# Patient Record
Sex: Female | Born: 1955 | Race: White | Hispanic: No | Marital: Married | Smoking: Former smoker
Health system: Southern US, Community
[De-identification: ages and names within clinical notes are randomized; demographics above are authoritative.]

## PROBLEM LIST (undated history)

## (undated) DIAGNOSIS — D649 Anemia, unspecified: Secondary | ICD-10-CM

## (undated) DIAGNOSIS — Z8719 Personal history of other diseases of the digestive system: Secondary | ICD-10-CM

## (undated) DIAGNOSIS — F329 Major depressive disorder, single episode, unspecified: Secondary | ICD-10-CM

## (undated) DIAGNOSIS — IMO0001 Reserved for inherently not codable concepts without codable children: Secondary | ICD-10-CM

## (undated) DIAGNOSIS — F32A Depression, unspecified: Secondary | ICD-10-CM

## (undated) DIAGNOSIS — K222 Esophageal obstruction: Secondary | ICD-10-CM

## (undated) DIAGNOSIS — M810 Age-related osteoporosis without current pathological fracture: Secondary | ICD-10-CM

## (undated) DIAGNOSIS — I10 Essential (primary) hypertension: Secondary | ICD-10-CM

## (undated) DIAGNOSIS — K219 Gastro-esophageal reflux disease without esophagitis: Secondary | ICD-10-CM

## (undated) DIAGNOSIS — J45909 Unspecified asthma, uncomplicated: Secondary | ICD-10-CM

## (undated) DIAGNOSIS — R1013 Epigastric pain: Secondary | ICD-10-CM

## (undated) HISTORY — DX: Age-related osteoporosis without current pathological fracture: M81.0

## (undated) HISTORY — DX: Reserved for inherently not codable concepts without codable children: IMO0001

## (undated) HISTORY — DX: Gastro-esophageal reflux disease without esophagitis: K21.9

## (undated) HISTORY — DX: Essential (primary) hypertension: I10

---
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
## 1996-03-31 HISTORY — PX: ANAL FISTULECTOMY: SHX1139

## 1996-03-31 HISTORY — PX: FISTULOTOMY: SHX6413

## 1998-09-11 ENCOUNTER — Other Ambulatory Visit: Admission: RE | Admit: 1998-09-11 | Discharge: 1998-09-11 | Payer: Self-pay | Admitting: Obstetrics and Gynecology

## 1999-10-09 ENCOUNTER — Other Ambulatory Visit: Admission: RE | Admit: 1999-10-09 | Discharge: 1999-10-09 | Payer: Self-pay | Admitting: Obstetrics and Gynecology

## 2000-10-29 ENCOUNTER — Other Ambulatory Visit: Admission: RE | Admit: 2000-10-29 | Discharge: 2000-10-29 | Payer: Self-pay | Admitting: Obstetrics and Gynecology

## 2001-11-26 ENCOUNTER — Other Ambulatory Visit: Admission: RE | Admit: 2001-11-26 | Discharge: 2001-11-26 | Payer: Self-pay | Admitting: Obstetrics and Gynecology

## 2002-11-29 ENCOUNTER — Other Ambulatory Visit: Admission: RE | Admit: 2002-11-29 | Discharge: 2002-11-29 | Payer: Self-pay | Admitting: Obstetrics and Gynecology

## 2003-12-06 ENCOUNTER — Other Ambulatory Visit: Admission: RE | Admit: 2003-12-06 | Discharge: 2003-12-06 | Payer: Self-pay | Admitting: Obstetrics and Gynecology

## 2004-04-26 ENCOUNTER — Ambulatory Visit: Payer: Self-pay | Admitting: Internal Medicine

## 2004-06-19 ENCOUNTER — Ambulatory Visit: Payer: Self-pay | Admitting: Internal Medicine

## 2004-11-28 ENCOUNTER — Ambulatory Visit: Payer: Self-pay | Admitting: Internal Medicine

## 2004-12-31 ENCOUNTER — Other Ambulatory Visit: Admission: RE | Admit: 2004-12-31 | Discharge: 2004-12-31 | Payer: Self-pay | Admitting: Obstetrics and Gynecology

## 2005-01-17 ENCOUNTER — Ambulatory Visit: Payer: Self-pay

## 2005-03-21 ENCOUNTER — Ambulatory Visit: Payer: Self-pay | Admitting: Internal Medicine

## 2005-10-06 ENCOUNTER — Ambulatory Visit: Payer: Self-pay | Admitting: Internal Medicine

## 2006-01-20 ENCOUNTER — Ambulatory Visit: Payer: Self-pay | Admitting: Internal Medicine

## 2006-02-17 ENCOUNTER — Ambulatory Visit: Payer: Self-pay | Admitting: Gastroenterology

## 2006-12-22 ENCOUNTER — Ambulatory Visit: Payer: Self-pay | Admitting: Internal Medicine

## 2007-01-20 ENCOUNTER — Ambulatory Visit: Payer: Self-pay | Admitting: Physician Assistant

## 2007-01-25 ENCOUNTER — Ambulatory Visit: Payer: Self-pay

## 2007-10-12 ENCOUNTER — Ambulatory Visit: Payer: Self-pay | Admitting: Internal Medicine

## 2008-01-27 ENCOUNTER — Ambulatory Visit: Payer: Self-pay

## 2008-05-19 ENCOUNTER — Ambulatory Visit: Payer: Self-pay | Admitting: Internal Medicine

## 2008-10-30 ENCOUNTER — Ambulatory Visit: Payer: Self-pay | Admitting: Gastroenterology

## 2009-02-01 ENCOUNTER — Ambulatory Visit: Payer: Self-pay

## 2009-07-16 ENCOUNTER — Other Ambulatory Visit: Payer: Self-pay | Admitting: Podiatry

## 2009-12-25 ENCOUNTER — Other Ambulatory Visit: Payer: Self-pay | Admitting: Internal Medicine

## 2010-01-03 ENCOUNTER — Other Ambulatory Visit: Payer: Self-pay | Admitting: Internal Medicine

## 2010-03-27 ENCOUNTER — Ambulatory Visit: Payer: Self-pay

## 2010-07-22 ENCOUNTER — Other Ambulatory Visit: Payer: Self-pay | Admitting: Internal Medicine

## 2010-08-22 ENCOUNTER — Ambulatory Visit: Payer: Self-pay | Admitting: Gastroenterology

## 2010-12-09 ENCOUNTER — Other Ambulatory Visit: Payer: Self-pay | Admitting: Internal Medicine

## 2011-05-02 ENCOUNTER — Ambulatory Visit: Payer: Self-pay | Admitting: Internal Medicine

## 2011-07-23 ENCOUNTER — Other Ambulatory Visit: Payer: Self-pay | Admitting: Internal Medicine

## 2011-07-23 LAB — CBC WITH DIFFERENTIAL/PLATELET
Basophil %: 1 %
Eosinophil #: 0.1 10*3/uL (ref 0.0–0.7)
HGB: 11.6 g/dL — ABNORMAL LOW (ref 12.0–16.0)
MCH: 25.8 pg — ABNORMAL LOW (ref 26.0–34.0)
MCHC: 32.4 g/dL (ref 32.0–36.0)
Monocyte #: 0.5 x10 3/mm (ref 0.2–0.9)
Monocyte %: 7.3 %
Neutrophil #: 3.4 10*3/uL (ref 1.4–6.5)
Neutrophil %: 54.2 %
RDW: 18.2 % — ABNORMAL HIGH (ref 11.5–14.5)
WBC: 6.3 10*3/uL (ref 3.6–11.0)

## 2011-07-23 LAB — BASIC METABOLIC PANEL
BUN: 14 mg/dL (ref 7–18)
Chloride: 105 mmol/L (ref 98–107)
Co2: 27 mmol/L (ref 21–32)
EGFR (African American): >60
EGFR (Non-African Amer.): >60
Glucose: 83 mg/dL (ref 65–99)
Osmolality: 281 (ref 275–301)
Sodium: 141 mmol/L (ref 136–145)

## 2011-07-23 LAB — URINALYSIS, COMPLETE
Bacteria: NONE SEEN
Bilirubin,UR: NEGATIVE
Ketone: NEGATIVE
Leukocyte Esterase: NEGATIVE
Protein: NEGATIVE
WBC UR: 1 /HPF (ref 0–5)

## 2011-07-23 LAB — IRON: Iron: 50 ug/dL (ref 50–170)

## 2011-07-23 LAB — HEPATIC FUNCTION PANEL A (ARMC)
Albumin: 4 g/dL (ref 3.4–5.0)
Alkaline Phosphatase: 43 U/L — ABNORMAL LOW (ref 50–136)
Bilirubin, Direct: <0.1 mg/dL (ref 0.00–0.20)
SGOT(AST): 27 U/L (ref 15–37)

## 2011-07-23 LAB — TSH: Thyroid Stimulating Horm: 1.79 u[IU]/mL

## 2012-07-02 ENCOUNTER — Ambulatory Visit: Payer: Self-pay | Admitting: Internal Medicine

## 2012-08-13 ENCOUNTER — Other Ambulatory Visit: Payer: Self-pay | Admitting: Internal Medicine

## 2012-08-13 LAB — COMPREHENSIVE METABOLIC PANEL
Albumin: 3.9 g/dL (ref 3.4–5.0)
Anion Gap: 5 — ABNORMAL LOW (ref 7–16)
BUN: 11 mg/dL (ref 7–18)
Bilirubin,Total: 0.2 mg/dL (ref 0.2–1.0)
Co2: 30 mmol/L (ref 21–32)
EGFR (African American): >60
Osmolality: 276 (ref 275–301)
Potassium: 3.8 mmol/L (ref 3.5–5.1)
SGPT (ALT): 30 U/L (ref 12–78)
Sodium: 139 mmol/L (ref 136–145)

## 2012-08-13 LAB — URINALYSIS, COMPLETE
Blood: NEGATIVE
Glucose,UR: NEGATIVE mg/dL (ref 0–75)
Ph: 8 (ref 4.5–8.0)
RBC,UR: 2 /HPF (ref 0–5)
Specific Gravity: 1.01 (ref 1.003–1.030)
WBC UR: 2 /HPF (ref 0–5)

## 2012-08-13 LAB — CBC WITH DIFFERENTIAL/PLATELET
Basophil #: 0.1 10*3/uL (ref 0.0–0.1)
Eosinophil %: 1.4 %
HGB: 12.6 g/dL (ref 12.0–16.0)
Lymphocyte %: 32.1 %
MCH: 28.5 pg (ref 26.0–34.0)
MCHC: 33.9 g/dL (ref 32.0–36.0)
MCV: 84 fL (ref 80–100)
Monocyte %: 6.9 %
Platelet: 250 10*3/uL (ref 150–440)
RBC: 4.42 10*6/uL (ref 3.80–5.20)
RDW: 14.6 % — ABNORMAL HIGH (ref 11.5–14.5)
WBC: 7.4 10*3/uL (ref 3.6–11.0)

## 2013-02-10 ENCOUNTER — Encounter: Payer: Self-pay | Admitting: Podiatry

## 2013-02-11 ENCOUNTER — Ambulatory Visit (INDEPENDENT_AMBULATORY_CARE_PROVIDER_SITE_OTHER): Payer: 59

## 2013-02-11 ENCOUNTER — Encounter: Payer: Self-pay | Admitting: Podiatry

## 2013-02-11 ENCOUNTER — Ambulatory Visit (INDEPENDENT_AMBULATORY_CARE_PROVIDER_SITE_OTHER): Payer: 59 | Admitting: Podiatry

## 2013-02-11 VITALS — Ht 64.0 in | Wt 178.0 lb

## 2013-02-11 DIAGNOSIS — M2040 Other hammer toe(s) (acquired), unspecified foot: Secondary | ICD-10-CM

## 2013-02-11 DIAGNOSIS — Z9889 Other specified postprocedural states: Secondary | ICD-10-CM

## 2013-02-11 DIAGNOSIS — M779 Enthesopathy, unspecified: Secondary | ICD-10-CM

## 2013-02-11 NOTE — Progress Notes (Signed)
Subjective:     Patient ID: Lisa Herman, female   DOB: 04-Sep-1955, 57 y.o.   MRN: [id number]  HPI patient states I'm doing much better and I am walking without discomfort in very happy with my results   Review of Systems     Objective:   Physical Exam  Nursing note and vitals reviewed. Constitutional: She is oriented to person, place, and time.  Cardiovascular: Intact distal pulses.   Musculoskeletal: Normal range of motion.  Neurological: She is oriented to person, place, and time.   patient's right toe is in very good alignment and the range of motion is excellent    Assessment:     Well-healed after surgery of the second metatarsal right    Plan:     Reviewed findings and discharge patient and less any symptoms should occur after x-rays were discussed with her

## 2013-02-15 ENCOUNTER — Encounter: Payer: Self-pay | Admitting: Podiatry

## 2013-03-08 ENCOUNTER — Other Ambulatory Visit: Payer: Self-pay | Admitting: Internal Medicine

## 2013-03-08 LAB — BASIC METABOLIC PANEL
Anion Gap: 4 — ABNORMAL LOW (ref 7–16)
BUN: 16 mg/dL (ref 7–18)
Calcium, Total: 9.1 mg/dL (ref 8.5–10.1)
Chloride: 103 mmol/L (ref 98–107)
Co2: 33 mmol/L — ABNORMAL HIGH (ref 21–32)
Creatinine: 0.77 mg/dL (ref 0.60–1.30)
EGFR (Non-African Amer.): >60
Sodium: 140 mmol/L (ref 136–145)

## 2013-03-08 LAB — HEPATIC FUNCTION PANEL A (ARMC)
Alkaline Phosphatase: 55 U/L
Bilirubin,Total: 0.3 mg/dL (ref 0.2–1.0)
SGPT (ALT): 42 U/L (ref 12–78)
Total Protein: 7.3 g/dL (ref 6.4–8.2)

## 2013-03-08 LAB — LIPID PANEL
HDL Cholesterol: 38 mg/dL — ABNORMAL LOW (ref 40–60)
Ldl Cholesterol, Calc: 168 mg/dL — ABNORMAL HIGH (ref 0–100)
Triglycerides: 190 mg/dL (ref 0–200)

## 2013-09-16 ENCOUNTER — Ambulatory Visit: Payer: Self-pay | Admitting: Internal Medicine

## 2013-11-01 ENCOUNTER — Ambulatory Visit: Payer: Self-pay | Admitting: Internal Medicine

## 2014-03-06 DIAGNOSIS — D649 Anemia, unspecified: Secondary | ICD-10-CM | POA: Insufficient documentation

## 2014-05-05 ENCOUNTER — Ambulatory Visit: Payer: 59 | Admitting: Gastroenterology

## 2015-03-14 DIAGNOSIS — I10 Essential (primary) hypertension: Secondary | ICD-10-CM | POA: Insufficient documentation

## 2015-03-14 DIAGNOSIS — F3342 Major depressive disorder, recurrent, in full remission: Secondary | ICD-10-CM | POA: Insufficient documentation

## 2015-04-16 DIAGNOSIS — I10 Essential (primary) hypertension: Secondary | ICD-10-CM | POA: Diagnosis not present

## 2015-04-16 DIAGNOSIS — R739 Hyperglycemia, unspecified: Secondary | ICD-10-CM | POA: Diagnosis not present

## 2015-04-16 DIAGNOSIS — E784 Other hyperlipidemia: Secondary | ICD-10-CM | POA: Diagnosis not present

## 2015-04-16 DIAGNOSIS — R1013 Epigastric pain: Secondary | ICD-10-CM | POA: Diagnosis not present

## 2015-04-30 DIAGNOSIS — Z1231 Encounter for screening mammogram for malignant neoplasm of breast: Secondary | ICD-10-CM | POA: Diagnosis not present

## 2015-05-17 DIAGNOSIS — F3342 Major depressive disorder, recurrent, in full remission: Secondary | ICD-10-CM | POA: Diagnosis not present

## 2015-05-17 DIAGNOSIS — R739 Hyperglycemia, unspecified: Secondary | ICD-10-CM | POA: Diagnosis not present

## 2015-05-17 DIAGNOSIS — I10 Essential (primary) hypertension: Secondary | ICD-10-CM | POA: Diagnosis not present

## 2015-05-17 DIAGNOSIS — D6489 Other specified anemias: Secondary | ICD-10-CM | POA: Diagnosis not present

## 2015-07-20 DIAGNOSIS — N390 Urinary tract infection, site not specified: Secondary | ICD-10-CM | POA: Diagnosis not present

## 2015-07-20 DIAGNOSIS — R399 Unspecified symptoms and signs involving the genitourinary system: Secondary | ICD-10-CM | POA: Diagnosis not present

## 2015-09-28 ENCOUNTER — Telehealth: Payer: Self-pay | Admitting: Family

## 2015-09-28 DIAGNOSIS — N390 Urinary tract infection, site not specified: Secondary | ICD-10-CM

## 2015-09-28 MED ORDER — SULFAMETHOXAZOLE-TRIMETHOPRIM 800-160 MG PO TABS
1.0000 | ORAL_TABLET | Freq: Two times a day (BID) | ORAL | Status: DC
Start: 2015-09-28 — End: 2016-01-04

## 2015-09-28 NOTE — Progress Notes (Signed)

## 2015-10-04 DIAGNOSIS — L570 Actinic keratosis: Secondary | ICD-10-CM | POA: Diagnosis not present

## 2015-10-04 DIAGNOSIS — D225 Melanocytic nevi of trunk: Secondary | ICD-10-CM | POA: Diagnosis not present

## 2015-10-04 DIAGNOSIS — L821 Other seborrheic keratosis: Secondary | ICD-10-CM | POA: Diagnosis not present

## 2015-10-04 DIAGNOSIS — D1800 Hemangioma unspecified site: Secondary | ICD-10-CM | POA: Diagnosis not present

## 2015-10-04 DIAGNOSIS — L814 Other melanin hyperpigmentation: Secondary | ICD-10-CM | POA: Diagnosis not present

## 2015-10-22 DIAGNOSIS — I10 Essential (primary) hypertension: Secondary | ICD-10-CM | POA: Diagnosis not present

## 2015-10-22 DIAGNOSIS — E784 Other hyperlipidemia: Secondary | ICD-10-CM | POA: Diagnosis not present

## 2015-10-22 DIAGNOSIS — D6489 Other specified anemias: Secondary | ICD-10-CM | POA: Diagnosis not present

## 2015-10-22 DIAGNOSIS — M810 Age-related osteoporosis without current pathological fracture: Secondary | ICD-10-CM | POA: Diagnosis not present

## 2015-10-22 DIAGNOSIS — R739 Hyperglycemia, unspecified: Secondary | ICD-10-CM | POA: Diagnosis not present

## 2015-11-02 ENCOUNTER — Telehealth: Payer: 59 | Admitting: Nurse Practitioner

## 2015-11-02 DIAGNOSIS — L237 Allergic contact dermatitis due to plants, except food: Secondary | ICD-10-CM

## 2015-11-02 MED ORDER — METHYLPREDNISOLONE 4 MG PO TBPK: ORAL_TABLET | ORAL | 0 refills | Status: DC

## 2015-11-02 NOTE — Progress Notes (Signed)

## 2015-11-05 ENCOUNTER — Other Ambulatory Visit: Payer: Self-pay | Admitting: Internal Medicine

## 2015-11-05 DIAGNOSIS — Z0001 Encounter for general adult medical examination with abnormal findings: Secondary | ICD-10-CM | POA: Diagnosis not present

## 2015-11-05 DIAGNOSIS — R739 Hyperglycemia, unspecified: Secondary | ICD-10-CM | POA: Diagnosis not present

## 2015-11-05 DIAGNOSIS — R748 Abnormal levels of other serum enzymes: Secondary | ICD-10-CM | POA: Diagnosis not present

## 2015-11-05 DIAGNOSIS — E784 Other hyperlipidemia: Secondary | ICD-10-CM | POA: Diagnosis not present

## 2015-11-05 DIAGNOSIS — M810 Age-related osteoporosis without current pathological fracture: Secondary | ICD-10-CM | POA: Diagnosis not present

## 2015-11-08 ENCOUNTER — Ambulatory Visit
Admission: RE | Admit: 2015-11-08 | Discharge: 2015-11-08 | Disposition: A | Discharge status: Home / Self Care | Payer: 59 | Source: Ambulatory Visit | Attending: Internal Medicine | Admitting: Internal Medicine

## 2015-11-08 DIAGNOSIS — R932 Abnormal findings on diagnostic imaging of liver and biliary tract: Secondary | ICD-10-CM | POA: Diagnosis not present

## 2015-11-08 DIAGNOSIS — R748 Abnormal levels of other serum enzymes: Secondary | ICD-10-CM | POA: Diagnosis not present

## 2015-11-08 DIAGNOSIS — R7989 Other specified abnormal findings of blood chemistry: Secondary | ICD-10-CM | POA: Diagnosis not present

## 2015-11-22 DIAGNOSIS — Z01419 Encounter for gynecological examination (general) (routine) without abnormal findings: Secondary | ICD-10-CM | POA: Diagnosis not present

## 2015-11-22 DIAGNOSIS — Z6831 Body mass index (BMI) 31.0-31.9, adult: Secondary | ICD-10-CM | POA: Diagnosis not present

## 2015-12-06 ENCOUNTER — Encounter: Payer: Self-pay | Admitting: Urology

## 2015-12-06 ENCOUNTER — Ambulatory Visit (INDEPENDENT_AMBULATORY_CARE_PROVIDER_SITE_OTHER): Payer: 59 | Admitting: Urology

## 2015-12-06 VITALS — BP 129/90 | HR 109 | Ht 64.0 in | Wt 182.0 lb

## 2015-12-06 DIAGNOSIS — N390 Urinary tract infection, site not specified: Secondary | ICD-10-CM

## 2015-12-06 DIAGNOSIS — N3281 Overactive bladder: Secondary | ICD-10-CM | POA: Diagnosis not present

## 2015-12-06 DIAGNOSIS — M810 Age-related osteoporosis without current pathological fracture: Secondary | ICD-10-CM | POA: Insufficient documentation

## 2015-12-06 DIAGNOSIS — R1013 Epigastric pain: Secondary | ICD-10-CM | POA: Insufficient documentation

## 2015-12-06 DIAGNOSIS — R739 Hyperglycemia, unspecified: Secondary | ICD-10-CM | POA: Insufficient documentation

## 2015-12-06 DIAGNOSIS — E785 Hyperlipidemia, unspecified: Secondary | ICD-10-CM | POA: Insufficient documentation

## 2015-12-06 LAB — URINALYSIS, COMPLETE
BILIRUBIN UA: NEGATIVE
GLUCOSE, UA: NEGATIVE
Ketones, UA: NEGATIVE
LEUKOCYTES UA: NEGATIVE
Nitrite, UA: NEGATIVE
PROTEIN UA: NEGATIVE
SPEC GRAV UA: 1.015 (ref 1.005–1.030)
UUROB: 0.2 mg/dL (ref 0.2–1.0)
pH, UA: 7.5 (ref 5.0–7.5)

## 2015-12-06 LAB — MICROSCOPIC EXAMINATION: Bacteria, UA: NONE SEEN

## 2015-12-06 LAB — BLADDER SCAN AMB NON-IMAGING: SCAN RESULT: 47

## 2015-12-06 MED ORDER — MIRABEGRON ER 25 MG PO TB24: 25.0000 mg | ORAL_TABLET | Freq: Every day | ORAL | 11 refills | Status: DC

## 2015-12-06 NOTE — Progress Notes (Signed)
12/06/2015 11:59 AM   Lisa Herman November 20, 1955 [id number]  Referring provider: Adin Hector, MD [place], [place] [address]  Chief Complaint  Patient presents with  . New Patient (Initial Visit)    recurrent UTI    HPI: The patient is a 60 year old female who presents for urinary symptoms as well as recurrent UTI. Since menopause, she has had 3-4 UTIs per year. She also had asymptomatic bacteriuria in April 2017 with Proteus. She was not treated for this.  She also has intermittent urinary urgency and nocturia 5. She feels this feels different than her UTIs. It is intermittent but when it occurs it is very bothersome.   Of note she has a strong family history of nephrolithiasis. Her mother recently underwent surgery for bilateral staghorn calculi.    She has been on estrogen therapy before but this was for vaginal dryness. She has had laser rejuvenation which has worked for dryness. She had a pelvic exam by her gynecologist 2 weeks ago which she reports as normal.   PMH: Past Medical History:  Diagnosis Date  . High blood pressure   . Osteoporosis   . Reflux     Surgical History: Past Surgical History:  Procedure Laterality Date  . ANAL FISTULECTOMY  1998  . FISTULOTOMY  1998  . TONSILLECTOMY  1972  . TONSILLECTOMY  1972    Home Medications:    Medication List       Accurate as of 12/06/15 11:59 AM. Always use your most recent med list.          albuterol 108 (90 Base) MCG/ACT inhaler Commonly known as:  PROVENTIL HFA;VENTOLIN HFA Inhale into the lungs.   aspirin EC 81 MG tablet Take by mouth.   ezetimibe 10 MG tablet Commonly known as:  ZETIA Take by mouth.   ibandronate 150 MG tablet Commonly known as:  BONIVA Take by mouth.   losartan-hydrochlorothiazide 100-25 MG tablet Commonly known as:  HYZAAR TAKE ONE TABLET BY MOUTH DAILY   methylPREDNISolone 4 MG Tbpk tablet Commonly known as:  MEDROL  DOSEPAK Take as directed   mirabegron ER 25 MG Tb24 tablet Commonly known as:  MYRBETRIQ Take 1 tablet (25 mg total) by mouth daily.   raloxifene 60 MG tablet Commonly known as:  EVISTA Take 60 mg by mouth daily.   sulfamethoxazole-trimethoprim 800-160 MG tablet Commonly known as:  BACTRIM DS,SEPTRA DS Take 1 tablet by mouth 2 (two) times daily.       Allergies:  Allergies  Allergen Reactions  . Atorvastatin     Other reaction(s): Abdominal Pain  . Bupropion     Other reaction(s): Other (See Comments) Elevated BP  . Sertraline     Other reaction(s): Other (See Comments) Prolonged QT interval  . Topiramate Other (See Comments)    Memory loss    Family History: Family History  Problem Relation Age of Onset  . High blood pressure Mother     Social History:  reports that she has quit smoking. Her smoking use included Cigarettes. She has never used smokeless tobacco. She reports that she drinks about 0.5 oz of alcohol per week . She reports that she does not use drugs.  ROS: UROLOGY Frequent Urination?: Yes Hard to postpone urination?: Yes Burning/pain with urination?: No Get up at night to urinate?: Yes Leakage of urine?: Yes Urine stream starts and stops?: No Trouble starting stream?: No Do you have to strain to urinate?: No Blood  in urine?: No Urinary tract infection?: Yes Sexually transmitted disease?: No Injury to kidneys or bladder?: No Painful intercourse?: No Weak stream?: Yes Currently pregnant?: No Vaginal bleeding?: No Last menstrual period?: n  Gastrointestinal Nausea?: No Vomiting?: No Indigestion/heartburn?: Yes Diarrhea?: No Constipation?: No  Constitutional Fever: No Night sweats?: No Weight loss?: No Fatigue?: No  Skin Skin rash/lesions?: No Itching?: No  Eyes Blurred vision?: No Double vision?: No  Ears/Nose/Throat Sore throat?: No Sinus problems?: No  Hematologic/Lymphatic Swollen glands?: No Easy bruising?:  No  Cardiovascular Leg swelling?: No Chest pain?: No  Respiratory Cough?: No Shortness of breath?: No  Endocrine Excessive thirst?: No  Musculoskeletal Back pain?: No Joint pain?: No  Neurological Headaches?: No Dizziness?: No  Psychologic Depression?: No Anxiety?: No  Physical Exam: BP 129/90   Pulse (!) 109 Comment: possible related to menopausal Hot Flash  Ht 5\' 4"  (1.626 m)   Wt 182 lb (82.6 kg) Comment: pt reported.  BMI 31.24 kg/m   Constitutional:  Alert and oriented, No acute distress. HEENT: [place] AT, moist mucus membranes.  Trachea midline, no masses. Cardiovascular: No clubbing, cyanosis, or edema. Respiratory: Normal respiratory effort, no increased work of breathing. GI: Abdomen is soft, nontender, nondistended, no abdominal masses GU: No CVA tenderness.  Skin: No rashes, bruises or suspicious lesions. Lymph: No cervical or inguinal adenopathy. Neurologic: Grossly intact, no focal deficits, moving all 4 extremities. Psychiatric: Normal mood and affect.  Laboratory Data: Lab Results  Component Value Date   WBC 7.4 08/13/2012   HGB 12.6 08/13/2012   HCT 37.1 08/13/2012   MCV 84 08/13/2012   PLT 250 08/13/2012    Lab Results  Component Value Date   CREATININE 0.77 03/08/2013    No results found for: PSA  No results found for: TESTOSTERONE  Lab Results  Component Value Date   HGBA1C 5.7 03/08/2013    Urinalysis    Component Value Date/Time   COLORURINE Colorless 08/13/2012 0905   APPEARANCEUR Clear 08/13/2012 0905   LABSPEC 1.010 08/13/2012 0905   PHURINE 8.0 08/13/2012 0905   GLUCOSEU Negative 08/13/2012 0905   HGBUR Negative 08/13/2012 0905   BILIRUBINUR Negative 08/13/2012 0905   KETONESUR Negative 08/13/2012 0905   PROTEINUR Negative 08/13/2012 0905   NITRITE Negative 08/13/2012 0905   LEUKOCYTESUR 1+ 08/13/2012 0905     Assessment & Plan:    1. Recurrent UTI -Will obtain renal ultrasound at this time to rule out  nephrolithiasis. She is at increased risk due to her Proteus UTI. Could consider estrogen cream at follow up if ultrasound negative.  2. Overactive bladder We'll start the patient was Myrbetriq 25 mg daily. Prescription was sent to her pharmacy.  Return in about 4 weeks (around 01/03/2016) for after renal u/s.  Nickie Retort, MD  [facility] [address] [place], [place] [address] [phone]

## 2015-12-07 ENCOUNTER — Telehealth: Payer: Self-pay | Admitting: Urology

## 2015-12-07 DIAGNOSIS — N3281 Overactive bladder: Secondary | ICD-10-CM

## 2015-12-07 MED ORDER — MIRABEGRON ER 25 MG PO TB24: 25.0000 mg | ORAL_TABLET | Freq: Every day | ORAL | 11 refills | Status: DC

## 2015-12-07 NOTE — Telephone Encounter (Signed)
Pt called and would like Myrbetriq RX to be sent to [facility] employee pharmacy instead of [facility].  It is $75 cheaper there.

## 2015-12-07 NOTE — Telephone Encounter (Signed)
Medication sent to employee pharmacy.

## 2015-12-20 ENCOUNTER — Ambulatory Visit
Admission: RE | Admit: 2015-12-20 | Discharge: 2015-12-20 | Disposition: A | Discharge status: Home / Self Care | Payer: 59 | Source: Ambulatory Visit | Attending: Urology | Admitting: Urology

## 2015-12-20 DIAGNOSIS — N390 Urinary tract infection, site not specified: Secondary | ICD-10-CM | POA: Insufficient documentation

## 2016-01-04 ENCOUNTER — Ambulatory Visit (INDEPENDENT_AMBULATORY_CARE_PROVIDER_SITE_OTHER): Payer: 59 | Admitting: Urology

## 2016-01-04 ENCOUNTER — Encounter: Payer: Self-pay | Admitting: Urology

## 2016-01-04 VITALS — BP 118/84 | HR 92 | Ht 64.0 in

## 2016-01-04 DIAGNOSIS — N390 Urinary tract infection, site not specified: Secondary | ICD-10-CM

## 2016-01-04 DIAGNOSIS — N3281 Overactive bladder: Secondary | ICD-10-CM

## 2016-01-04 NOTE — Progress Notes (Signed)
01/04/2016 9:02 AM   Lisa Herman 1955/05/04 [id number]  Referring provider: Adin Hector, MD [place], [place] [address]  Chief Complaint  Patient presents with  . Recurrent UTI    f/u Renal US    HPI: The patient is a 60 year old female who presents for urinary symptoms as well as recurrent UTI. Since menopause, she has had 3-4 UTIs per year. She also had asymptomatic bacteriuria in April 2017 with Proteus. She was not treated for this.  She also has intermittent urinary urgency and nocturia 5. She feels this feels different than her UTIs. It is intermittent but when it occurs it is very bothersome.   Of note she has a strong family history of nephrolithiasis. Her mother recently underwent surgery for bilateral staghorn calculi.    She has been on estrogen therapy before but this was for vaginal dryness. She has had laser rejuvenation which has worked for dryness. She had a pelvic exam by her gynecologist 2 weeks ago which she reports as normal.    Renal ultrasound shows no stones or hydronephrosis.  She as started on Myrbetriq 25 mg at he last visit. This is working very well for her.Her urinary symptoms have completely resolved. She has no nocturia. She has no frequency. PVR 60.  No symptomatic UTIs in the last 4 months.   PMH: Past Medical History:  Diagnosis Date  . High blood pressure   . Osteoporosis   . Reflux     Surgical History: Past Surgical History:  Procedure Laterality Date  . ANAL FISTULECTOMY  1998  . FISTULOTOMY  1998  . TONSILLECTOMY  1972  . TONSILLECTOMY  1972    Home Medications:    Medication List       Accurate as of 01/04/16  9:02 AM. Always use your most recent med list.          albuterol 108 (90 Base) MCG/ACT inhaler Commonly known as:  PROVENTIL HFA;VENTOLIN HFA Inhale into the lungs.   aspirin EC 81 MG tablet Take by mouth.   ezetimibe 10 MG tablet Commonly known as:   ZETIA Take by mouth.   ibandronate 150 MG tablet Commonly known as:  BONIVA Take by mouth.   losartan-hydrochlorothiazide 100-25 MG tablet Commonly known as:  HYZAAR TAKE ONE TABLET BY MOUTH DAILY   mirabegron ER 25 MG Tb24 tablet Commonly known as:  MYRBETRIQ Take 1 tablet (25 mg total) by mouth daily.   raloxifene 60 MG tablet Commonly known as:  EVISTA Take 60 mg by mouth daily.       Allergies:  Allergies  Allergen Reactions  . Atorvastatin     Other reaction(s): Abdominal Pain  . Bupropion     Other reaction(s): Other (See Comments) Elevated BP  . Sertraline     Other reaction(s): Other (See Comments) Prolonged QT interval  . Topiramate Other (See Comments)    Memory loss    Family History: Family History  Problem Relation Age of Onset  . High blood pressure Mother     Social History:  reports that she has quit smoking. Her smoking use included Cigarettes. She has never used smokeless tobacco. She reports that she drinks about 0.5 oz of alcohol per week . She reports that she does not use drugs.  ROS: UROLOGY Frequent Urination?: No Hard to postpone urination?: No Burning/pain with urination?: No Get up at night to urinate?: No Leakage of urine?: No Urine stream starts and  stops?: No Trouble starting stream?: No Do you have to strain to urinate?: No Blood in urine?: No Urinary tract infection?: No Sexually transmitted disease?: No Injury to kidneys or bladder?: No Painful intercourse?: No Weak stream?: No Currently pregnant?: No Vaginal bleeding?: No Last menstrual period?: n  Gastrointestinal Nausea?: No Vomiting?: No Indigestion/heartburn?: No Diarrhea?: No Constipation?: No  Constitutional Fever: No Night sweats?: No Weight loss?: No Fatigue?: No  Skin Skin rash/lesions?: No Itching?: No  Eyes Blurred vision?: No Double vision?: No  Ears/Nose/Throat Sore throat?: No Sinus problems?: No  Hematologic/Lymphatic Swollen  glands?: No Easy bruising?: No  Cardiovascular Leg swelling?: No Chest pain?: No  Respiratory Cough?: No Shortness of breath?: No  Endocrine Excessive thirst?: No  Musculoskeletal Back pain?: No Joint pain?: No  Neurological Headaches?: No Dizziness?: No  Psychologic Depression?: No Anxiety?: No  Physical Exam: BP 118/84   Pulse 92   Ht 5\' 4"  (1.626 m)   Constitutional:  Alert and oriented, No acute distress. HEENT: [place] AT, moist mucus membranes.  Trachea midline, no masses. Cardiovascular: No clubbing, cyanosis, or edema. Respiratory: Normal respiratory effort, no increased work of breathing. GI: Abdomen is soft, nontender, nondistended, no abdominal masses GU: No CVA tenderness.  Skin: No rashes, bruises or suspicious lesions. Lymph: No cervical or inguinal adenopathy. Neurologic: Grossly intact, no focal deficits, moving all 4 extremities. Psychiatric: Normal mood and affect.  Laboratory Data: Lab Results  Component Value Date   WBC 7.4 08/13/2012   HGB 12.6 08/13/2012   HCT 37.1 08/13/2012   MCV 84 08/13/2012   PLT 250 08/13/2012    Lab Results  Component Value Date   CREATININE 0.77 03/08/2013    No results found for: PSA  No results found for: TESTOSTERONE  Lab Results  Component Value Date   HGBA1C 5.7 03/08/2013    Urinalysis    Component Value Date/Time   COLORURINE Colorless 08/13/2012 0905   APPEARANCEUR Cloudy (A) 12/06/2015 1118   LABSPEC 1.010 08/13/2012 0905   PHURINE 8.0 08/13/2012 0905   GLUCOSEU Negative 12/06/2015 1118   GLUCOSEU Negative 08/13/2012 0905   HGBUR Negative 08/13/2012 0905   BILIRUBINUR Negative 12/06/2015 1118   BILIRUBINUR Negative 08/13/2012 0905   KETONESUR Negative 08/13/2012 0905   PROTEINUR Negative 12/06/2015 1118   PROTEINUR Negative 08/13/2012 0905   NITRITE Negative 12/06/2015 1118   NITRITE Negative 08/13/2012 0905   LEUKOCYTESUR Negative 12/06/2015 1118   LEUKOCYTESUR 1+ 08/13/2012 0905     Pertinent Imaging: Reviewed as above  Assessment & Plan:  1. OAB -myrbetriq 25 mg daily. It is working well.  2. Recurrent UTI -no UTIs in last 4 months. Will hold off on treatment at this time. The patient will contact us if these become more frequent. At that time, we can consider vaginal estrogen cream.   Return in about 1 year (around 01/03/2017).  Nickie Retort, MD  [facility] [address], [place], [place] [address] [phone]

## 2016-03-13 DIAGNOSIS — H5213 Myopia, bilateral: Secondary | ICD-10-CM | POA: Diagnosis not present

## 2016-03-15 ENCOUNTER — Telehealth: Payer: 59 | Admitting: Physician Assistant

## 2016-03-15 DIAGNOSIS — R300 Dysuria: Secondary | ICD-10-CM

## 2016-03-15 MED ORDER — CEPHALEXIN 500 MG PO CAPS: 500.0000 mg | ORAL_CAPSULE | Freq: Two times a day (BID) | ORAL | 0 refills | Status: DC

## 2016-03-15 NOTE — Progress Notes (Signed)

## 2016-05-01 DIAGNOSIS — I10 Essential (primary) hypertension: Secondary | ICD-10-CM | POA: Diagnosis not present

## 2016-05-01 DIAGNOSIS — Z87440 Personal history of urinary (tract) infections: Secondary | ICD-10-CM | POA: Diagnosis not present

## 2016-05-01 DIAGNOSIS — R739 Hyperglycemia, unspecified: Secondary | ICD-10-CM | POA: Diagnosis not present

## 2016-05-01 DIAGNOSIS — D6489 Other specified anemias: Secondary | ICD-10-CM | POA: Diagnosis not present

## 2016-05-01 DIAGNOSIS — E784 Other hyperlipidemia: Secondary | ICD-10-CM | POA: Diagnosis not present

## 2016-05-08 DIAGNOSIS — R739 Hyperglycemia, unspecified: Secondary | ICD-10-CM | POA: Diagnosis not present

## 2016-05-08 DIAGNOSIS — E784 Other hyperlipidemia: Secondary | ICD-10-CM | POA: Diagnosis not present

## 2016-05-08 DIAGNOSIS — I10 Essential (primary) hypertension: Secondary | ICD-10-CM | POA: Diagnosis not present

## 2016-05-08 DIAGNOSIS — M810 Age-related osteoporosis without current pathological fracture: Secondary | ICD-10-CM | POA: Diagnosis not present

## 2016-05-14 ENCOUNTER — Telehealth: Payer: Self-pay

## 2016-05-14 NOTE — Telephone Encounter (Signed)
PA for myrbetriq was DENIED!

## 2016-10-08 DIAGNOSIS — D225 Melanocytic nevi of trunk: Secondary | ICD-10-CM | POA: Diagnosis not present

## 2016-10-08 DIAGNOSIS — L814 Other melanin hyperpigmentation: Secondary | ICD-10-CM | POA: Diagnosis not present

## 2016-10-08 DIAGNOSIS — L821 Other seborrheic keratosis: Secondary | ICD-10-CM | POA: Diagnosis not present

## 2016-10-08 DIAGNOSIS — L570 Actinic keratosis: Secondary | ICD-10-CM | POA: Diagnosis not present

## 2016-10-08 DIAGNOSIS — Z1231 Encounter for screening mammogram for malignant neoplasm of breast: Secondary | ICD-10-CM | POA: Diagnosis not present

## 2016-10-08 DIAGNOSIS — D1801 Hemangioma of skin and subcutaneous tissue: Secondary | ICD-10-CM | POA: Diagnosis not present

## 2016-10-08 DIAGNOSIS — L309 Dermatitis, unspecified: Secondary | ICD-10-CM | POA: Diagnosis not present

## 2016-10-08 DIAGNOSIS — D485 Neoplasm of uncertain behavior of skin: Secondary | ICD-10-CM | POA: Diagnosis not present

## 2016-10-29 DIAGNOSIS — R739 Hyperglycemia, unspecified: Secondary | ICD-10-CM | POA: Diagnosis not present

## 2016-10-29 DIAGNOSIS — I10 Essential (primary) hypertension: Secondary | ICD-10-CM | POA: Diagnosis not present

## 2016-10-29 DIAGNOSIS — D649 Anemia, unspecified: Secondary | ICD-10-CM | POA: Diagnosis not present

## 2016-11-05 DIAGNOSIS — R1013 Epigastric pain: Secondary | ICD-10-CM | POA: Diagnosis not present

## 2016-11-05 DIAGNOSIS — E784 Other hyperlipidemia: Secondary | ICD-10-CM | POA: Diagnosis not present

## 2016-11-05 DIAGNOSIS — M810 Age-related osteoporosis without current pathological fracture: Secondary | ICD-10-CM | POA: Diagnosis not present

## 2016-11-05 DIAGNOSIS — Z0000 Encounter for general adult medical examination without abnormal findings: Secondary | ICD-10-CM | POA: Diagnosis not present

## 2016-11-05 DIAGNOSIS — I10 Essential (primary) hypertension: Secondary | ICD-10-CM | POA: Diagnosis not present

## 2016-11-27 DIAGNOSIS — D649 Anemia, unspecified: Secondary | ICD-10-CM | POA: Diagnosis not present

## 2017-01-02 ENCOUNTER — Ambulatory Visit: Payer: 59

## 2017-01-15 DIAGNOSIS — Z6832 Body mass index (BMI) 32.0-32.9, adult: Secondary | ICD-10-CM | POA: Diagnosis not present

## 2017-01-15 DIAGNOSIS — Z01419 Encounter for gynecological examination (general) (routine) without abnormal findings: Secondary | ICD-10-CM | POA: Diagnosis not present

## 2017-01-28 DIAGNOSIS — K222 Esophageal obstruction: Secondary | ICD-10-CM | POA: Diagnosis not present

## 2017-01-28 DIAGNOSIS — K219 Gastro-esophageal reflux disease without esophagitis: Secondary | ICD-10-CM | POA: Diagnosis not present

## 2017-01-28 DIAGNOSIS — R1319 Other dysphagia: Secondary | ICD-10-CM | POA: Diagnosis not present

## 2017-01-28 DIAGNOSIS — K449 Diaphragmatic hernia without obstruction or gangrene: Secondary | ICD-10-CM | POA: Diagnosis not present

## 2017-03-26 DIAGNOSIS — M810 Age-related osteoporosis without current pathological fracture: Secondary | ICD-10-CM | POA: Diagnosis not present

## 2017-03-28 DIAGNOSIS — H524 Presbyopia: Secondary | ICD-10-CM | POA: Diagnosis not present

## 2017-04-06 ENCOUNTER — Encounter: Payer: Self-pay | Admitting: *Deleted

## 2017-04-07 ENCOUNTER — Ambulatory Visit: Payer: 59 | Admitting: Anesthesiology

## 2017-04-07 ENCOUNTER — Ambulatory Visit
Admission: RE | Admit: 2017-04-07 | Discharge: 2017-04-07 | Disposition: A | Discharge status: Home / Self Care | Payer: 59 | Source: Ambulatory Visit | Attending: Internal Medicine | Admitting: Internal Medicine

## 2017-04-07 ENCOUNTER — Other Ambulatory Visit: Payer: Self-pay

## 2017-04-07 ENCOUNTER — Encounter
Admission: RE | Disposition: A | Discharge status: Home / Self Care | Payer: Self-pay | Source: Ambulatory Visit | Attending: Internal Medicine

## 2017-04-07 ENCOUNTER — Emergency Department: Payer: 59

## 2017-04-07 ENCOUNTER — Encounter: Payer: Self-pay | Admitting: Emergency Medicine

## 2017-04-07 ENCOUNTER — Inpatient Hospital Stay
Admission: EM | Admit: 2017-04-07 | Discharge: 2017-04-09 | DRG: 393 | Disposition: A | Discharge status: Home / Self Care | Payer: 59 | Attending: Family Medicine | Admitting: Family Medicine

## 2017-04-07 DIAGNOSIS — J45909 Unspecified asthma, uncomplicated: Secondary | ICD-10-CM | POA: Diagnosis present

## 2017-04-07 DIAGNOSIS — Y849 Medical procedure, unspecified as the cause of abnormal reaction of the patient, or of later complication, without mention of misadventure at the time of the procedure: Secondary | ICD-10-CM | POA: Diagnosis present

## 2017-04-07 DIAGNOSIS — K222 Esophageal obstruction: Secondary | ICD-10-CM | POA: Diagnosis not present

## 2017-04-07 DIAGNOSIS — Z79899 Other long term (current) drug therapy: Secondary | ICD-10-CM

## 2017-04-07 DIAGNOSIS — K3189 Other diseases of stomach and duodenum: Secondary | ICD-10-CM | POA: Diagnosis present

## 2017-04-07 DIAGNOSIS — K209 Esophagitis, unspecified: Secondary | ICD-10-CM | POA: Diagnosis not present

## 2017-04-07 DIAGNOSIS — R112 Nausea with vomiting, unspecified: Secondary | ICD-10-CM | POA: Diagnosis present

## 2017-04-07 DIAGNOSIS — Z7983 Long term (current) use of bisphosphonates: Secondary | ICD-10-CM

## 2017-04-07 DIAGNOSIS — I10 Essential (primary) hypertension: Secondary | ICD-10-CM | POA: Diagnosis not present

## 2017-04-07 DIAGNOSIS — Z888 Allergy status to other drugs, medicaments and biological substances status: Secondary | ICD-10-CM

## 2017-04-07 DIAGNOSIS — E876 Hypokalemia: Secondary | ICD-10-CM | POA: Diagnosis present

## 2017-04-07 DIAGNOSIS — J690 Pneumonitis due to inhalation of food and vomit: Secondary | ICD-10-CM | POA: Diagnosis not present

## 2017-04-07 DIAGNOSIS — K9181 Other intraoperative complications of digestive system: Principal | ICD-10-CM | POA: Diagnosis present

## 2017-04-07 DIAGNOSIS — K449 Diaphragmatic hernia without obstruction or gangrene: Secondary | ICD-10-CM | POA: Diagnosis present

## 2017-04-07 DIAGNOSIS — Z7982 Long term (current) use of aspirin: Secondary | ICD-10-CM

## 2017-04-07 DIAGNOSIS — R05 Cough: Secondary | ICD-10-CM | POA: Diagnosis not present

## 2017-04-07 DIAGNOSIS — K210 Gastro-esophageal reflux disease with esophagitis: Secondary | ICD-10-CM | POA: Diagnosis not present

## 2017-04-07 DIAGNOSIS — R918 Other nonspecific abnormal finding of lung field: Secondary | ICD-10-CM | POA: Diagnosis not present

## 2017-04-07 DIAGNOSIS — Z87891 Personal history of nicotine dependence: Secondary | ICD-10-CM | POA: Diagnosis not present

## 2017-04-07 DIAGNOSIS — Y848 Other medical procedures as the cause of abnormal reaction of the patient, or of later complication, without mention of misadventure at the time of the procedure: Secondary | ICD-10-CM | POA: Diagnosis present

## 2017-04-07 DIAGNOSIS — M810 Age-related osteoporosis without current pathological fracture: Secondary | ICD-10-CM | POA: Diagnosis present

## 2017-04-07 DIAGNOSIS — T819XXA Unspecified complication of procedure, initial encounter: Secondary | ICD-10-CM | POA: Diagnosis present

## 2017-04-07 HISTORY — DX: Epigastric pain: R10.13

## 2017-04-07 HISTORY — DX: Depression, unspecified: F32.A

## 2017-04-07 HISTORY — PX: ESOPHAGOGASTRODUODENOSCOPY (EGD) WITH PROPOFOL: SHX5813

## 2017-04-07 HISTORY — DX: Major depressive disorder, single episode, unspecified: F32.9

## 2017-04-07 HISTORY — DX: Personal history of other diseases of the digestive system: Z87.19

## 2017-04-07 HISTORY — DX: Unspecified asthma, uncomplicated: J45.909

## 2017-04-07 HISTORY — DX: Anemia, unspecified: D64.9

## 2017-04-07 HISTORY — DX: Gastro-esophageal reflux disease without esophagitis: K21.9

## 2017-04-07 HISTORY — DX: Esophageal obstruction: K22.2

## 2017-04-07 LAB — CBC WITH DIFFERENTIAL/PLATELET
BASOS PCT: 0 %
Basophils Absolute: 0.1 10*3/uL (ref 0–0.1)
EOS PCT: 0 %
Eosinophils Absolute: 0 10*3/uL (ref 0–0.7)
HCT: 41 % (ref 35.0–47.0)
HEMOGLOBIN: 13.6 g/dL (ref 12.0–16.0)
Lymphocytes Relative: 6 %
Lymphs Abs: 1.3 10*3/uL (ref 1.0–3.6)
MCH: 28.3 pg (ref 26.0–34.0)
MCHC: 33.1 g/dL (ref 32.0–36.0)
MCV: 85.3 fL (ref 80.0–100.0)
MONOS PCT: 5 %
Monocytes Absolute: 1.1 10*3/uL — ABNORMAL HIGH (ref 0.2–0.9)
NEUTROS PCT: 89 %
Neutro Abs: 19.2 10*3/uL — ABNORMAL HIGH (ref 1.4–6.5)
PLATELETS: 267 10*3/uL (ref 150–440)
RBC: 4.8 MIL/uL (ref 3.80–5.20)
RDW: 15.9 % — AB (ref 11.5–14.5)
WBC: 21.7 10*3/uL — ABNORMAL HIGH (ref 3.6–11.0)

## 2017-04-07 LAB — URINALYSIS, COMPLETE (UACMP) WITH MICROSCOPIC
BACTERIA UA: NONE SEEN
BILIRUBIN URINE: NEGATIVE
Glucose, UA: NEGATIVE mg/dL
Ketones, ur: NEGATIVE mg/dL
NITRITE: NEGATIVE
Protein, ur: NEGATIVE mg/dL
SPECIFIC GRAVITY, URINE: 1.009 (ref 1.005–1.030)
pH: 7 (ref 5.0–8.0)

## 2017-04-07 LAB — COMPREHENSIVE METABOLIC PANEL
ALBUMIN: 4.5 g/dL (ref 3.5–5.0)
ALK PHOS: 45 U/L (ref 38–126)
ALT: 59 U/L — ABNORMAL HIGH (ref 14–54)
ANION GAP: 12 (ref 5–15)
AST: 50 U/L — ABNORMAL HIGH (ref 15–41)
BUN: 13 mg/dL (ref 6–20)
CHLORIDE: 99 mmol/L — AB (ref 101–111)
CO2: 24 mmol/L (ref 22–32)
Calcium: 9.8 mg/dL (ref 8.9–10.3)
Creatinine, Ser: 0.68 mg/dL (ref 0.44–1.00)
GFR calc non Af Amer: >60 mL/min (ref >60)
GLUCOSE: 137 mg/dL — AB (ref 65–99)
POTASSIUM: 3.6 mmol/L (ref 3.5–5.1)
SODIUM: 135 mmol/L (ref 135–145)
Total Bilirubin: 0.8 mg/dL (ref 0.3–1.2)
Total Protein: 7.3 g/dL (ref 6.5–8.1)

## 2017-04-07 LAB — INFLUENZA PANEL BY PCR (TYPE A & B)
INFLAPCR: NEGATIVE
INFLBPCR: NEGATIVE

## 2017-04-07 SURGERY — ESOPHAGOGASTRODUODENOSCOPY (EGD) WITH PROPOFOL
Anesthesia: General

## 2017-04-07 MED ORDER — MIDAZOLAM HCL 2 MG/2ML IJ SOLN
INTRAMUSCULAR | Status: DC | PRN
  Administered 2017-04-07: 2 mg via INTRAVENOUS

## 2017-04-07 MED ORDER — ONDANSETRON HCL 4 MG/2ML IJ SOLN: 4.0000 mg | Freq: Four times a day (QID) | INTRAMUSCULAR | Status: DC | PRN

## 2017-04-07 MED ORDER — VANCOMYCIN HCL IN DEXTROSE 1-5 GM/200ML-% IV SOLN
1000.0000 mg | Freq: Once | INTRAVENOUS | Status: AC
  Administered 2017-04-07: 1000 mg via INTRAVENOUS
  Filled 2017-04-07: qty 200

## 2017-04-07 MED ORDER — PIPERACILLIN-TAZOBACTAM 3.375 G IVPB
3.3750 g | Freq: Three times a day (TID) | INTRAVENOUS | Status: DC
  Administered 2017-04-08 – 2017-04-09 (×5): 3.375 g via INTRAVENOUS
  Filled 2017-04-07 (×5): qty 50

## 2017-04-07 MED ORDER — SODIUM CHLORIDE 0.9 % IV BOLUS (SEPSIS)
1000.0000 mL | Freq: Once | INTRAVENOUS | Status: AC
  Administered 2017-04-07: 1000 mL via INTRAVENOUS

## 2017-04-07 MED ORDER — SODIUM CHLORIDE 0.9 % IV SOLN
INTRAVENOUS | Status: DC
  Administered 2017-04-07: 14:00:00 via INTRAVENOUS
  Administered 2017-04-07: 1000 mL via INTRAVENOUS

## 2017-04-07 MED ORDER — LIDOCAINE HCL (CARDIAC) 20 MG/ML IV SOLN
INTRAVENOUS | Status: DC | PRN
  Administered 2017-04-07: 80 mg via INTRAVENOUS

## 2017-04-07 MED ORDER — PIPERACILLIN-TAZOBACTAM 3.375 G IVPB 30 MIN: 3.3750 g | Freq: Four times a day (QID) | INTRAVENOUS | Status: DC

## 2017-04-07 MED ORDER — ACETAMINOPHEN 325 MG PO TABS: 650.0000 mg | ORAL_TABLET | Freq: Four times a day (QID) | ORAL | Status: DC | PRN

## 2017-04-07 MED ORDER — HYDROCODONE-ACETAMINOPHEN 5-325 MG PO TABS: 1.0000 | ORAL_TABLET | ORAL | Status: DC | PRN

## 2017-04-07 MED ORDER — HEPARIN SODIUM (PORCINE) 5000 UNIT/ML IJ SOLN
5000.0000 [IU] | Freq: Three times a day (TID) | INTRAMUSCULAR | Status: DC
  Filled 2017-04-07: qty 1

## 2017-04-07 MED ORDER — GLYCOPYRROLATE 0.2 MG/ML IJ SOLN
INTRAMUSCULAR | Status: AC
  Filled 2017-04-07: qty 1

## 2017-04-07 MED ORDER — DOCUSATE SODIUM 100 MG PO CAPS
100.0000 mg | ORAL_CAPSULE | Freq: Two times a day (BID) | ORAL | Status: DC
  Administered 2017-04-08 – 2017-04-09 (×2): 100 mg via ORAL
  Filled 2017-04-07 (×3): qty 1

## 2017-04-07 MED ORDER — TRAZODONE HCL 50 MG PO TABS: 25.0000 mg | ORAL_TABLET | Freq: Every evening | ORAL | Status: DC | PRN

## 2017-04-07 MED ORDER — PROPOFOL 500 MG/50ML IV EMUL
INTRAVENOUS | Status: AC
  Filled 2017-04-07: qty 50

## 2017-04-07 MED ORDER — PROPOFOL 10 MG/ML IV BOLUS
INTRAVENOUS | Status: DC | PRN
  Administered 2017-04-07: 30 mg via INTRAVENOUS
  Administered 2017-04-07 (×2): 50 mg via INTRAVENOUS

## 2017-04-07 MED ORDER — IPRATROPIUM-ALBUTEROL 0.5-2.5 (3) MG/3ML IN SOLN
3.0000 mL | Freq: Once | RESPIRATORY_TRACT | Status: AC
  Administered 2017-04-07: 3 mL via RESPIRATORY_TRACT
  Filled 2017-04-07: qty 3

## 2017-04-07 MED ORDER — BISACODYL 5 MG PO TBEC: 5.0000 mg | DELAYED_RELEASE_TABLET | Freq: Every day | ORAL | Status: DC | PRN

## 2017-04-07 MED ORDER — ONDANSETRON HCL 4 MG PO TABS: 4.0000 mg | ORAL_TABLET | Freq: Four times a day (QID) | ORAL | Status: DC | PRN

## 2017-04-07 MED ORDER — GLYCOPYRROLATE 0.2 MG/ML IJ SOLN
INTRAMUSCULAR | Status: DC | PRN
  Administered 2017-04-07: 0.2 mg via INTRAVENOUS

## 2017-04-07 MED ORDER — IOPAMIDOL (ISOVUE-300) INJECTION 61%
75.0000 mL | Freq: Once | INTRAVENOUS | Status: AC | PRN
  Administered 2017-04-07: 75 mL via INTRAVENOUS

## 2017-04-07 MED ORDER — SODIUM CHLORIDE 0.9 % IV SOLN
INTRAVENOUS | Status: DC
  Administered 2017-04-07: via INTRAVENOUS

## 2017-04-07 MED ORDER — LIDOCAINE HCL (PF) 2 % IJ SOLN
INTRAMUSCULAR | Status: AC
  Filled 2017-04-07: qty 10

## 2017-04-07 MED ORDER — PIPERACILLIN-TAZOBACTAM 3.375 G IVPB 30 MIN
3.3750 g | Freq: Once | INTRAVENOUS | Status: AC
Start: 2017-04-07 — End: 2017-04-07
  Administered 2017-04-07: 3.375 g via INTRAVENOUS
  Filled 2017-04-07: qty 50

## 2017-04-07 MED ORDER — ACETAMINOPHEN 650 MG RE SUPP: 650.0000 mg | Freq: Four times a day (QID) | RECTAL | Status: DC | PRN

## 2017-04-07 MED ORDER — MIDAZOLAM HCL 2 MG/2ML IJ SOLN
INTRAMUSCULAR | Status: AC
  Filled 2017-04-07: qty 2

## 2017-04-07 NOTE — H&P (Signed)
Outpatient short stay form Pre-procedure 04/07/2017 1:11 PM Teodoro K. Alice Reichert, M.D.  Primary Physician:  Ramonita Lab, M.D.  Reason for visit:  Dysphagia, Epigastric pain.  History of present illness:  Patient c/o dysphagia intermittent with previous positive response to esophageal dilatioon empirically. Patient has mild postprandial epigastric discomfort as well without melena.   Current Facility-Administered Medications:  .  0.9 %  sodium chloride infusion, , Intravenous, Continuous, [place], Benay Pike, MD, Last Rate: 20 mL/hr at 04/07/17 1258, 1,000 mL at 04/07/17 1258  Medications Prior to Admission  Medication Sig Dispense Refill Last Dose  . aspirin EC 81 MG tablet Take by mouth.   04/07/2017 at Unknown time  . Calcium Carb-Cholecalciferol (CALCIUM CARBONATE-VITAMIN D3 PO) Take by mouth.   04/07/2017 at Unknown time  . cephALEXin (KEFLEX) 500 MG capsule Take 1 capsule (500 mg total) by mouth 2 (two) times daily. 14 capsule 0 Past Week at Unknown time  . ezetimibe (ZETIA) 10 MG tablet Take by mouth.   04/07/2017 at Unknown time  . losartan-hydrochlorothiazide (HYZAAR) 100-25 MG tablet TAKE ONE TABLET BY MOUTH DAILY   04/07/2017 at Unknown time  . metoprolol succinate (TOPROL-XL) 50 MG 24 hr tablet Take 50 mg by mouth daily. Take with or immediately following a meal.   04/07/2017 at Unknown time  . mirabegron ER (MYRBETRIQ) 25 MG TB24 tablet Take 1 tablet (25 mg total) by mouth daily. 30 tablet 11 Past Month at Unknown time  . Multiple Vitamin (MULTIVITAMIN) tablet Take 1 tablet by mouth daily.   04/07/2017 at Unknown time  . raloxifene (EVISTA) 60 MG tablet Take 60 mg by mouth daily.   Past Month at Unknown time  . albuterol (PROVENTIL HFA;VENTOLIN HFA) 108 (90 Base) MCG/ACT inhaler Inhale into the lungs.   Taking     Allergies  Allergen Reactions  . Atorvastatin     Other reaction(s): Abdominal Pain  . Bupropion     Other reaction(s): Other (See Comments) Elevated BP  . Sertraline     Other  reaction(s): Other (See Comments) Prolonged QT interval  . Topiramate Other (See Comments)    Memory loss     Past Medical History:  Diagnosis Date  . Anemia   . Depression   . Dyspepsia   . Esophageal stricture   . GERD (gastroesophageal reflux disease)   . High blood pressure   . History of hiatal hernia   . Osteoporosis   . Osteoporosis   . Reflux     Review of systems:      Physical Exam  General appearance: alert, cooperative and appears stated age Resp: clear to auscultation bilaterally Cardio: regular rate and rhythm, S1, S2 normal, no murmur, click, rub or gallop GI: soft, non-tender; bowel sounds normal; no masses,  no organomegaly     Planned procedures: EGD. The patient understands the nature of the planned procedure, indications, risks, alternatives and potential complications including but not limited to bleeding, infection, perforation, damage to internal organs and possible oversedation/side effects from anesthesia. The patient agrees and gives consent to proceed.  Please refer to procedure notes for findings, recommendations and patient disposition/instructions.    Teodoro K. Alice Reichert, M.D. Gastroenterology 04/07/2017  1:11 PM

## 2017-04-07 NOTE — Anesthesia Post-op Follow-up Note (Signed)
Anesthesia QCDR form completed.        

## 2017-04-07 NOTE — Transfer of Care (Signed)
Immediate Anesthesia Transfer of Care Note  Patient: Lisa Herman  Procedure(s) Performed: ESOPHAGOGASTRODUODENOSCOPY (EGD) WITH PROPOFOL (N/A )  Patient Location: Endoscopy Unit  Anesthesia Type:General  Level of Consciousness: awake and patient cooperative  Airway & Oxygen Therapy: Patient Spontanous Breathing and Patient connected to nasal cannula oxygen  Post-op Assessment: Report given to RN, Post -op Vital signs reviewed and stable and Patient moving all extremities X 4  Post vital signs: Reviewed and stable  Last Vitals:  Vitals:   04/07/17 1241  BP: (!) 151/88  Pulse: 74  Resp: 18  Temp: (!) 36.1 C  SpO2: 96%    Last Pain:  Vitals:   04/07/17 1241  TempSrc: Oral         Complications: No apparent anesthesia complications

## 2017-04-07 NOTE — Anesthesia Postprocedure Evaluation (Signed)
Anesthesia Post Note  Patient: Lisa Herman  Procedure(s) Performed: ESOPHAGOGASTRODUODENOSCOPY (EGD) WITH PROPOFOL (N/A )  Patient location during evaluation: Endoscopy Anesthesia Type: General Level of consciousness: awake and alert Pain management: pain level controlled Vital Signs Assessment: post-procedure vital signs reviewed and stable Respiratory status: spontaneous breathing and respiratory function stable Cardiovascular status: stable Anesthetic complications: no     Last Vitals:  Vitals:   04/07/17 1414 04/07/17 1424  BP: 110/89 131/82  Pulse: 78 69  Resp: 12 14  Temp:    SpO2: 93% 95%    Last Pain:  Vitals:   04/07/17 1404  TempSrc: Tympanic                 Al Gagen K

## 2017-04-07 NOTE — Op Note (Signed)
[facility] Gastroenterology Patient Name: Lisa Herman Procedure Date: 04/07/2017 1:23 PM MRN: [id number] Account #: [id number] Date of Birth: [birth year] Admit Type: Outpatient Age: 62 Room: [facility] ENDO ROOM 4 Gender: Female Note Status: Finalized Procedure:            Upper GI endoscopy Indications:          Dysphagia, Heartburn Providers:            Benay Pike. Alice Reichert MD, MD Referring MD:         Ramonita Lab, MD (Referring MD) Medicines:            Propofol per Anesthesia Complications:        No immediate complications. Procedure:            Pre-Anesthesia Assessment:                       - The risks and benefits of the procedure and the                        sedation options and risks were discussed with the                        patient. All questions were answered and informed                        consent was obtained.                       - Patient identification and proposed procedure were                        verified prior to the procedure by the nurse. The                        procedure was verified in the procedure room.                       - ASA Grade Assessment: II - A patient with mild                        systemic disease.                       - After reviewing the risks and benefits, the patient                        was deemed in satisfactory condition to undergo the                        procedure.                       After obtaining informed consent, the endoscope was                        passed under direct vision. Throughout the procedure,                        the patient's blood pressure, pulse, and oxygen  saturations were monitored continuously. The Endoscope                        was introduced through the mouth, and advanced to the                        third part of duodenum. The upper GI endoscopy was                        accomplished without difficulty. The patient tolerated             the procedure well. Findings:      LA Grade A (one or more mucosal breaks less than 5 mm, not extending       between tops of 2 mucosal folds) esophagitis with no bleeding was found       in the distal esophagus.      One moderate benign-appearing, intrinsic stenosis was found in the       distal esophagus. This measured 1.1 cm (inner diameter) x less than one       cm (in length) and was traversed. A guidewire was placed and the scope       was withdrawn. Dilation was performed with a Savary dilator with no       resistance at 48 Fr and 51 Fr and moderate resistance at 51 Fr.       Estimated blood loss was minimal.      A large hiatal hernia was present.      Localized mildly erythematous mucosa without bleeding was found in the       gastric antrum. Biopsies were taken with a cold forceps for Helicobacter       pylori testing.      The exam was otherwise without abnormality.      The examined duodenum was normal. Impression:           - LA Grade A reflux esophagitis.                       - Benign-appearing esophageal stenosis. Dilated.                       - Large hiatal hernia.                       - Erythematous mucosa in the antrum. Biopsied.                       - The examination was otherwise normal.                       - Normal examined duodenum. Recommendation:       - Await pathology results.                       - Repeat upper endoscopy PRN.                       - Return to GI office in 3 months.                       - Full liquid diet - advance as tolerated to resume  regular diet.                       - Use Aciphex (rabeprazole) 20 mg PO daily daily. Procedure Code(s):    --- Professional ---                       [phone], Esophagogastroduodenoscopy, flexible, transoral;                        with insertion of guide wire followed by passage of                        dilator(s) through esophagus over guide wire                        [id number], Esophagogastroduodenoscopy, flexible, transoral;                        with biopsy, single or multiple Diagnosis Code(s):    --- Professional ---                       K21.0, Gastro-esophageal reflux disease with esophagitis                       K22.2, Esophageal obstruction                       K44.9, Diaphragmatic hernia without obstruction or                        gangrene                       K31.89, Other diseases of stomach and duodenum                       R13.10, Dysphagia, unspecified                       R12, Heartburn CPT copyright 2016 American Medical Association. All rights reserved. The codes documented in this report are preliminary and upon coder review may  be revised to meet current compliance requirements. Efrain Sella MD, MD 04/07/2017 1:56:50 PM This report has been signed electronically. Number of Addenda: 0 Note Initiated On: 04/07/2017 1:23 PM      [facility]

## 2017-04-07 NOTE — Interval H&P Note (Signed)
History and Physical Interval Note:  04/07/2017 1:14 PM  Lisa Herman  has presented today for surgery, with the diagnosis of GERD DYSPHAGIA  The various methods of treatment have been discussed with the patient and family. After consideration of risks, benefits and other options for treatment, the patient has consented to  Procedure(s): ESOPHAGOGASTRODUODENOSCOPY (EGD) WITH PROPOFOL (N/A) as a surgical intervention .  The patient's history has been reviewed, patient examined, no change in status, stable for surgery.  I have reviewed the patient's chart and labs.  Questions were answered to the patient's satisfaction.     [place]arson, [place]

## 2017-04-07 NOTE — Anesthesia Preprocedure Evaluation (Signed)
Anesthesia Evaluation  Patient identified by MRN, date of birth, ID band Patient awake    Reviewed: Allergy & Precautions, NPO status , Patient's Chart, lab work & pertinent test results, reviewed documented beta blocker date and time   History of Anesthesia Complications Negative for: history of anesthetic complications  Airway Mallampati: III       Dental   Pulmonary neg sleep apnea, COPD,  COPD inhaler, former smoker,           Cardiovascular hypertension, Pt. on medications and Pt. on home beta blockers (-) Past MI and (-) CHF (-) dysrhythmias (-) Valvular Problems/Murmurs     Neuro/Psych neg Seizures Depression    GI/Hepatic Neg liver ROS, hiatal hernia, Bowel prep,GERD  Medicated,  Endo/Other  neg diabetes  Renal/GU negative Renal ROS     Musculoskeletal   Abdominal   Peds  Hematology  (+) anemia ,   Anesthesia Other Findings   Reproductive/Obstetrics                             Anesthesia Physical Anesthesia Plan  ASA: II  Anesthesia Plan: General   Post-op Pain Management:    Induction:   PONV Risk Score and Plan: 3 and TIVA, Propofol infusion and Ondansetron  Airway Management Planned: Nasal Cannula  Additional Equipment:   Intra-op Plan:   Post-operative Plan:   Informed Consent: I have reviewed the patients History and Physical, chart, labs and discussed the procedure including the risks, benefits and alternatives for the proposed anesthesia with the patient or authorized representative who has indicated his/her understanding and acceptance.     Plan Discussed with:   Anesthesia Plan Comments:         Anesthesia Quick Evaluation

## 2017-04-07 NOTE — ED Triage Notes (Signed)
Pt arrived via POV to ED d/t lower back pain and productive cough after upper endoscopy earlier today. Pt reports tan sputum with her cough. Pt is A&O x4 at this time.

## 2017-04-07 NOTE — ED Provider Notes (Addendum)
[facility] Emergency Department Provider Note  ____________________________________________   I have reviewed the triage vital signs and the nursing notes. Where available I have reviewed prior notes and, if possible and indicated, outside hospital notes.    HISTORY  Chief Complaint Cough and Back Pain    HPI Lisa Herman is a 62 y.o. female who presents today complaining of cough and fever.  Patient states she was in her normal state of health today when she went for a routine endoscopy.  She was not intubated she was states however that after the endoscopy she was told that they had a great deal of secretions and needed repeated suction.  After the procedure, patient is having a cough which began shortly after, she states that she also had a fever up to 101 took Tylenol at home.  She denies any pain or swelling.  No dysuria no urinary frequency.  She states she is having tan colored sputum.  None of the symptoms were present earlier before the procedure     Past Medical History:  Diagnosis Date  . Anemia   . Depression   . Dyspepsia   . Esophageal stricture   . GERD (gastroesophageal reflux disease)   . High blood pressure   . History of hiatal hernia   . Osteoporosis   . Osteoporosis   . Reflux     Patient Active Problem List   Diagnosis Date Noted  . Dyspepsia 12/06/2015  . Hyperglycemia 12/06/2015  . Hyperlipidemia 12/06/2015  . 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 Elevated liver enzymes 11/05/2015  . Essential hypertension 03/14/2015  . Recurrent major depressive disorder, in full remission ([place]) 03/14/2015  . Anemia 03/06/2014    Past Surgical History:  Procedure Laterality Date  . ANAL FISTULECTOMY  1998  . FISTULOTOMY  1998  . TONSILLECTOMY  1972  . TONSILLECTOMY  1972    Prior to Admission medications   Medication Sig Start Date End Date Taking? Authorizing Provider  albuterol (PROVENTIL HFA;VENTOLIN HFA) 108 (90  Base) MCG/ACT inhaler Inhale into the lungs. 11/05/15   [provider]  aspirin EC 81 MG tablet Take by mouth.    [provider]  Calcium Carb-Cholecalciferol (CALCIUM CARBONATE-VITAMIN D3 PO) Take by mouth.    [provider]  cephALEXin (KEFLEX) 500 MG capsule Take 1 capsule (500 mg total) by mouth 2 (two) times daily. 03/15/16   Brunetta Jeans, PA-C  ezetimibe (ZETIA) 10 MG tablet Take by mouth. 11/05/15   [provider]  losartan-hydrochlorothiazide (HYZAAR) 100-25 MG tablet TAKE ONE TABLET BY MOUTH DAILY 02/06/15   [provider]  metoprolol succinate (TOPROL-XL) 50 MG 24 hr tablet Take 50 mg by mouth daily. Take with or immediately following a meal.    [provider]  mirabegron ER (MYRBETRIQ) 25 MG TB24 tablet Take 1 tablet (25 mg total) by mouth daily. 12/07/15   Nickie Retort, MD  Multiple Vitamin (MULTIVITAMIN) tablet Take 1 tablet by mouth daily.    [provider]  raloxifene (EVISTA) 60 MG tablet Take 60 mg by mouth daily.    [provider]    Allergies Atorvastatin; Bupropion; Sertraline; and Topiramate  Family History  Problem Relation Age of Onset  . High blood pressure Mother     Social History Social History   Tobacco Use  . Smoking status: Former Smoker    Types: Cigarettes  . Smokeless tobacco: Never Used  . Tobacco comment: quit  Substance Use Topics  .  Alcohol use: Yes    Alcohol/week: 0.5 oz    Types: 1 Standard drinks or equivalent per week    Comment: SOCIAL  . Drug use: No    Review of Systems Constitutional: Positive fever  Eyes: No visual changes. ENT: No sore throat. No stiff neck no neck pain Cardiovascular: Denies chest pain. Respiratory: Denies shortness of breath. Gastrointestinal:   no vomiting.  No diarrhea.  No constipation. Genitourinary: Negative for dysuria. Musculoskeletal: Negative lower extremity swelling Skin: Negative for rash. Neurological:  Negative for severe headaches, focal weakness or numbness.   ____________________________________________   PHYSICAL EXAM:  VITAL SIGNS: ED Triage Vitals  Enc Vitals Group     BP 04/07/17 1934 (!) 163/109     Pulse Rate 04/07/17 1934 (!) 102     Resp 04/07/17 1934 20     Temp 04/07/17 1934 100.1 F (37.8 C)     Temp Source 04/07/17 1934 Oral     SpO2 04/07/17 1929 96 %     Weight 04/07/17 1935 186 lb (84.4 kg)     Height 04/07/17 1935 5\' 4"  (1.626 m)     Head Circumference --      Peak Flow --      Pain Score 04/07/17 1929 6     Pain Loc --      Pain Edu? --      Excl. in [place]? --     Constitutional: Alert and oriented. Well appearing and in no acute distress. Eyes: Conjunctivae are normal Head: Atraumatic HEENT: No congestion/rhinnorhea. Mucous membranes are moist.  Oropharynx non-erythematous Neck:   Nontender with no meningismus, no masses, no stridor Cardiovascular: Tachycardia noted regular rhythm. Grossly normal heart sounds.  Good peripheral circulation. Respiratory: Normal respiratory effort.  No retractions.  There are coarse breath sounds in the right versus the left but no rales or rhonchi, cough appreciated, Abdominal: Soft and nontender. No distention. No guarding no rebound Back:  There is no focal tenderness or step off.  there is no midline tenderness there are no lesions noted. there is no CVA tenderness Musculoskeletal: No lower extremity tenderness, no upper extremity tenderness. No joint effusions, no DVT signs strong distal pulses no edema Neurologic:  Normal speech and language. No gross focal neurologic deficits are appreciated.  Skin:  Skin is warm, dry and intact. No rash noted. Psychiatric: Mood and affect are normal. Speech and behavior are normal.  ____________________________________________   LABS (all labs ordered are listed, but only abnormal results are displayed)  Labs Reviewed  CBC WITH DIFFERENTIAL/PLATELET  COMPREHENSIVE METABOLIC  PANEL  URINALYSIS, COMPLETE (UACMP) WITH MICROSCOPIC    Pertinent labs  results that were available during my care of the patient were reviewed by me and considered in my medical decision making (see chart for details). ____________________________________________  EKG  I personally interpreted any EKGs ordered by me or triage Sinus tachycardia rate 103, normal axis no acute ischemic changes ____________________________________________  RADIOLOGY  Pertinent labs & imaging results that were available during my care of the patient were reviewed by me and considered in my medical decision making (see chart for details). If possible, patient and/or family made aware of any abnormal findings.  No results found. ____________________________________________    PROCEDURES  Procedure(s) performed: None  Procedures  Critical Care performed: None  ____________________________________________   INITIAL IMPRESSION / ASSESSMENT AND PLAN / ED COURSE  Pertinent labs & imaging results that were available during my care of the patient were reviewed by me and  considered in my medical decision making (see chart for details).   Patient is here with fever, cough and coarse breath sounds on the right after endoscopy, differential does include a unrelated infection such as influenza which we will check for, it also includes aspiration pneumonia given the timing that is my primary concern.  Will get chest x-ray, will check basic blood work and we will give her some IV fluids and reassess  ----------------------------------------- 9:32 PM on 04/07/2017 -----------------------------------------  EKG shows considerable aspiration by my read radiology concurs, given patient symptoms she will need to be admitted for antibiotics.   ____________________________________________   FINAL CLINICAL IMPRESSION(S) / ED DIAGNOSES  Final diagnoses:  None      This chart was dictated using voice  recognition software.  Despite best efforts to proofread,  errors can occur which can change meaning.      Schuyler Amor, MD 04/07/17 1942    Schuyler Amor, MD 04/07/17 2133

## 2017-04-08 LAB — BASIC METABOLIC PANEL
Anion gap: 7 (ref 5–15)
BUN: 10 mg/dL (ref 6–20)
CO2: 27 mmol/L (ref 22–32)
CREATININE: 0.58 mg/dL (ref 0.44–1.00)
Calcium: 8.8 mg/dL — ABNORMAL LOW (ref 8.9–10.3)
Chloride: 106 mmol/L (ref 101–111)
GFR calc Af Amer: >60 mL/min (ref >60)
Glucose, Bld: 87 mg/dL (ref 65–99)
Potassium: 3.2 mmol/L — ABNORMAL LOW (ref 3.5–5.1)
SODIUM: 140 mmol/L (ref 135–145)

## 2017-04-08 LAB — GLUCOSE, CAPILLARY: Glucose-Capillary: 120 mg/dL — ABNORMAL HIGH (ref 65–99)

## 2017-04-08 LAB — CBC
HCT: 36.4 % (ref 35.0–47.0)
Hemoglobin: 12 g/dL (ref 12.0–16.0)
MCH: 28.7 pg (ref 26.0–34.0)
MCHC: 33 g/dL (ref 32.0–36.0)
MCV: 86.9 fL (ref 80.0–100.0)
Platelets: 257 10*3/uL (ref 150–440)
RBC: 4.19 MIL/uL (ref 3.80–5.20)
RDW: 16.2 % — AB (ref 11.5–14.5)
WBC: 18.9 10*3/uL — AB (ref 3.6–11.0)

## 2017-04-08 LAB — EXPECTORATED SPUTUM ASSESSMENT W REFEX TO RESP CULTURE

## 2017-04-08 LAB — EXPECTORATED SPUTUM ASSESSMENT W GRAM STAIN, RFLX TO RESP C: Special Requests: NORMAL

## 2017-04-08 LAB — MAGNESIUM: MAGNESIUM: 1.9 mg/dL (ref 1.7–2.4)

## 2017-04-08 MED ORDER — POTASSIUM CHLORIDE CRYS ER 20 MEQ PO TBCR
40.0000 meq | EXTENDED_RELEASE_TABLET | Freq: Once | ORAL | Status: AC
  Administered 2017-04-08: 40 meq via ORAL
  Filled 2017-04-08: qty 2

## 2017-04-08 MED ORDER — IBANDRONATE SODIUM 150 MG PO TABS: 150.0000 mg | ORAL_TABLET | ORAL | Status: DC

## 2017-04-08 MED ORDER — VANCOMYCIN HCL IN DEXTROSE 1-5 GM/200ML-% IV SOLN
1000.0000 mg | Freq: Two times a day (BID) | INTRAVENOUS | Status: DC
  Administered 2017-04-08: 1000 mg via INTRAVENOUS
  Filled 2017-04-08 (×2): qty 200

## 2017-04-08 MED ORDER — LOSARTAN POTASSIUM 50 MG PO TABS
100.0000 mg | ORAL_TABLET | Freq: Every day | ORAL | Status: DC
  Administered 2017-04-08 – 2017-04-09 (×2): 100 mg via ORAL
  Filled 2017-04-08 (×2): qty 2

## 2017-04-08 MED ORDER — FAMOTIDINE 20 MG PO TABS
10.0000 mg | ORAL_TABLET | Freq: Every day | ORAL | Status: DC
  Administered 2017-04-09: 10 mg via ORAL
  Filled 2017-04-08 (×2): qty 1

## 2017-04-08 MED ORDER — OMEGA-3-ACID ETHYL ESTERS 1 G PO CAPS
1.0000 g | ORAL_CAPSULE | Freq: Every day | ORAL | Status: DC
  Administered 2017-04-08 – 2017-04-09 (×2): 1 g via ORAL
  Filled 2017-04-08 (×2): qty 1

## 2017-04-08 MED ORDER — CALCIUM CARBONATE-VITAMIN D3 600-400 MG-UNIT PO TABS: ORAL_TABLET | Freq: Every day | ORAL | Status: DC

## 2017-04-08 MED ORDER — ALBUTEROL SULFATE (2.5 MG/3ML) 0.083% IN NEBU
3.0000 mL | INHALATION_SOLUTION | Freq: Four times a day (QID) | RESPIRATORY_TRACT | Status: DC
  Administered 2017-04-08 – 2017-04-09 (×6): 3 mL via RESPIRATORY_TRACT
  Filled 2017-04-08 (×6): qty 3

## 2017-04-08 MED ORDER — ADULT MULTIVITAMIN W/MINERALS CH
1.0000 | ORAL_TABLET | Freq: Every day | ORAL | Status: DC
  Administered 2017-04-08 – 2017-04-09 (×2): 1 via ORAL
  Filled 2017-04-08 (×2): qty 1

## 2017-04-08 MED ORDER — EZETIMIBE 10 MG PO TABS
10.0000 mg | ORAL_TABLET | Freq: Every day | ORAL | Status: DC
  Administered 2017-04-08 – 2017-04-09 (×2): 10 mg via ORAL
  Filled 2017-04-08 (×2): qty 1

## 2017-04-08 MED ORDER — RALOXIFENE HCL 60 MG PO TABS
60.0000 mg | ORAL_TABLET | Freq: Every day | ORAL | Status: DC
  Administered 2017-04-08 – 2017-04-09 (×2): 60 mg via ORAL
  Filled 2017-04-08 (×2): qty 1

## 2017-04-08 MED ORDER — LOSARTAN POTASSIUM-HCTZ 100-25 MG PO TABS: 1.0000 | ORAL_TABLET | Freq: Every day | ORAL | Status: DC

## 2017-04-08 MED ORDER — HYDROCHLOROTHIAZIDE 25 MG PO TABS
25.0000 mg | ORAL_TABLET | Freq: Every day | ORAL | Status: DC
  Administered 2017-04-08 – 2017-04-09 (×2): 25 mg via ORAL
  Filled 2017-04-08: qty 1

## 2017-04-08 MED ORDER — PANTOPRAZOLE SODIUM 40 MG PO TBEC
40.0000 mg | DELAYED_RELEASE_TABLET | Freq: Every day | ORAL | Status: DC
  Administered 2017-04-08 – 2017-04-09 (×2): 40 mg via ORAL
  Filled 2017-04-08 (×2): qty 1

## 2017-04-08 MED ORDER — CALCIUM CARBONATE-VITAMIN D 500-200 MG-UNIT PO TABS
1.0000 | ORAL_TABLET | Freq: Every day | ORAL | Status: DC
  Administered 2017-04-08 – 2017-04-09 (×2): 1 via ORAL
  Filled 2017-04-08 (×2): qty 1

## 2017-04-08 MED ORDER — METOPROLOL SUCCINATE ER 50 MG PO TB24
50.0000 mg | ORAL_TABLET | Freq: Every day | ORAL | Status: DC
  Administered 2017-04-08 – 2017-04-09 (×2): 50 mg via ORAL
  Filled 2017-04-08 (×2): qty 1

## 2017-04-08 MED ORDER — OMEGA-3-ACID ETHYL ESTERS 1 G PO CAPS: 1200.0000 mg | ORAL_CAPSULE | Freq: Every day | ORAL | Status: DC

## 2017-04-08 MED ORDER — ASPIRIN EC 81 MG PO TBEC
81.0000 mg | DELAYED_RELEASE_TABLET | Freq: Every day | ORAL | Status: DC
  Administered 2017-04-08 – 2017-04-09 (×2): 81 mg via ORAL
  Filled 2017-04-08 (×2): qty 1

## 2017-04-08 NOTE — Plan of Care (Signed)
Pt still afebrile. Reports improvement in breathing and cough. IV abx infusing. Probable discharge tomorrow.

## 2017-04-08 NOTE — Evaluation (Signed)
GI courtesy note  Pleasant 61 y/o female admitted to the hospital last evening after endoscopy with reported aspiration. WE performed esophageal dilation and apparently patient was SOB at discharge from endoscopy, per her history. She called my partner, Dr. Vira Agar, last evening to report her symptoms. She was promptly referred to the [facility] ED where she was evaluated and diagnosed with Aspiration pneumonia with a high WBC.  Today, patient reports her breathing, cough and splinting back pain are much improved.  I have spoken about the patient's case with DR. Bridgett Larsson, the attending, and appreciate his care.  I will be available if needed. Please call me at [phone] for any problems or questions.  Thank you,  T. Madolyn Frieze, M.D. [facility]. [phone]

## 2017-04-08 NOTE — H&P (Signed)
[facility] [place] NAME: Lisa Herman    MR#:  [id number]  DATE OF BIRTH:  12/23/1955  DATE OF ADMISSION:  04/07/2017  PRIMARY CARE PHYSICIAN: Adin Hector, MD   REQUESTING/REFERRING PHYSICIAN:   CHIEF COMPLAINT:   Chief Complaint  Patient presents with  . Cough  . Back Pain    HISTORY OF PRESENT ILLNESS: Lisa Herman  is a 62 y.o. female with a known history of hypertension, asthma and recurrent esophageal strictures. Patient underwent EGD procedure earlier in the day esophageal dilatation.  Status post procedure she was told that she had a lot of secretions and required frequent suctioning.  She was discharged home and 2 hours after the procedure she noted that she had fever 101, and shortness of breath and cough with tan color sputum.  Her symptoms improved in the emergency room with nebulizer treatment and cough medication. She was advised by gastroenterologist on call to come to emergency room.  WBC is elevated at 22,000 and chest x-ray shows pneumonia.  Chest CT confirmed the infiltrates and no evidence of post endoscopy complications.  Patient is admitted for further management.  PAST MEDICAL HISTORY:   Past Medical History:  Diagnosis Date  . Anemia   . Asthma   . Depression   . Dyspepsia   . Esophageal stricture   . GERD (gastroesophageal reflux disease)   . High blood pressure   . History of hiatal hernia   . Osteoporosis   . Osteoporosis   . Reflux     PAST SURGICAL HISTORY:  Past Surgical History:  Procedure Laterality Date  . ANAL FISTULECTOMY  1998  . FISTULOTOMY  1998  . TONSILLECTOMY  1972  . TONSILLECTOMY  1972    SOCIAL HISTORY:  Social History   Tobacco Use  . Smoking status: Former Smoker    Types: Cigarettes  . Smokeless tobacco: Never Used  . Tobacco comment: quit  Substance Use Topics  . Alcohol use: Yes    Alcohol/week: 0.5 oz    Types: 1 Standard drinks or equivalent per week     Comment: SOCIAL    FAMILY HISTORY:  Family History  Problem Relation Age of Onset  . High blood pressure Mother     DRUG ALLERGIES:  Allergies  Allergen Reactions  . Atorvastatin     Other reaction(s): Abdominal Pain  . Bupropion     Other reaction(s): Other (See Comments) Elevated BP  . Myrbetriq [Mirabegron]     elevated BP  . Sertraline     Other reaction(s): Other (See Comments) Prolonged QT interval    REVIEW OF SYSTEMS:   CONSTITUTIONAL: Fever 101, at home.  EYES: No blurred or double vision.  EARS, NOSE, AND THROAT: No tinnitus or ear pain.  RESPIRATORY: Patient complains of cough, shortness of breath and wheezing; mild hemoptysis.  CARDIOVASCULAR: No chest pain, orthopnea, edema.  GASTROINTESTINAL: Patient complains of nausea.  No vomiting, diarrhea or abdominal pain.  GENITOURINARY: No dysuria, hematuria.  ENDOCRINE: No polyuria, nocturia,  HEMATOLOGY: No anemia, easy bruising or bleeding SKIN: No rash or lesion. MUSCULOSKELETAL: No joint pain or arthritis.   NEUROLOGIC: No tingling, numbness, weakness.  PSYCHIATRY: No anxiety or depression.   MEDICATIONS AT HOME:  Prior to Admission medications   Medication Sig Start Date End Date Taking? Authorizing Provider  albuterol (PROVENTIL HFA;VENTOLIN HFA) 108 (90 Base) MCG/ACT inhaler Inhale into the lungs. 11/05/15  Yes [provider]  aspirin EC  81 MG tablet Take 81 mg by mouth daily.    Yes [provider]  Calcium Carb-Cholecalciferol (CALCIUM CARBONATE-VITAMIN D3 PO) Take 1 tablet by mouth daily.    Yes [provider]  ezetimibe (ZETIA) 10 MG tablet Take 10 mg by mouth daily.  11/05/15  Yes [provider]  ibandronate (BONIVA) 150 MG tablet TAKE 1 TABLET BY MOUTH ONCE A MONTH. TAKE FIRST THING IN THE MORNING WITH A FULL GLASS OF WATER. DO NOT EAT OR DRINK ANYTHING OR LIE DOWN FO 07/28/16  Yes [provider]  losartan-hydrochlorothiazide (HYZAAR) 100-25 MG tablet  TAKE ONE TABLET BY MOUTH DAILY 02/06/15  Yes [provider]  metoprolol succinate (TOPROL-XL) 50 MG 24 hr tablet Take 50 mg by mouth daily. Take with or immediately following a meal.   Yes [provider]  Multiple Vitamin (MULTIVITAMIN) tablet Take 1 tablet by mouth daily.   Yes [provider]  omega-3 acid ethyl esters (LOVAZA) 1 g capsule Take 1,200 mg by mouth daily.   Yes [provider]  RABEprazole (ACIPHEX) 20 MG tablet Take 20 mg by mouth daily.   Yes [provider]  ranitidine (ZANTAC) 150 MG tablet Take 150 mg by mouth daily as needed for heartburn.   Yes [provider]  cephALEXin (KEFLEX) 500 MG capsule Take 1 capsule (500 mg total) by mouth 2 (two) times daily. Patient not taking: Reported on 04/07/2017 03/15/16   Brunetta Jeans, PA-C  raloxifene (EVISTA) 60 MG tablet Take 60 mg by mouth daily.    [provider]      PHYSICAL EXAMINATION:   VITAL SIGNS: Blood pressure 135/80, pulse 91, temperature 98.9 F (37.2 C), temperature source Oral, resp. rate 20, height 5\' 4"  (1.626 m), weight 89.5 kg (197 lb 6.4 oz), SpO2 97 %.  GENERAL:  62 y.o.-year-old patient lying in the bed, currently in no acute distress, status post nebulizer treatment, cough medication and antinausea treatment.  EYES: Pupils equal, round, reactive to light and accommodation. No scleral icterus. Extraocular muscles intact.  HEENT: Head atraumatic, normocephalic. Oropharynx and nasopharynx clear.  NECK:  Supple, no jugular venous distention. No thyroid enlargement, no tenderness.  LUNGS: Reduced breath sounds and wheezing bilaterally. No use of accessory muscles of respiration.  CARDIOVASCULAR: S1, S2 normal. No murmurs, rubs, or gallops.  ABDOMEN: Soft, nontender, nondistended. Bowel sounds present. No organomegaly or mass.  EXTREMITIES: No pedal edema, cyanosis, or clubbing.  NEUROLOGIC: No focal weakness. Gait not checked.  PSYCHIATRIC: The  patient is alert and oriented x 3.  SKIN: No obvious rash, lesion, or ulcer.   LABORATORY PANEL:   CBC Recent Labs  Lab 04/07/17 1945  WBC 21.7*  HGB 13.6  HCT 41.0  PLT 267  MCV 85.3  MCH 28.3  MCHC 33.1  RDW 15.9*  LYMPHSABS 1.3  MONOABS 1.1*  EOSABS 0.0  BASOSABS 0.1   ------------------------------------------------------------------------------------------------------------------  Chemistries  Recent Labs  Lab 04/07/17 1945  NA 135  K 3.6  CL 99*  CO2 24  GLUCOSE 137*  BUN 13  CREATININE 0.68  CALCIUM 9.8  AST 50*  ALT 59*  ALKPHOS 45  BILITOT 0.8   ------------------------------------------------------------------------------------------------------------------ estimated creatinine clearance is 80 mL/min (by C-G formula based on SCr of 0.68 mg/dL). ------------------------------------------------------------------------------------------------------------------ No results for input(s): TSH, T4TOTAL, T3FREE, THYROIDAB in the last 72 hours.  Invalid input(s): FREET3   Coagulation profile No results for input(s): INR, PROTIME in the last 168 hours. ------------------------------------------------------------------------------------------------------------------- No results for input(s):  DDIMER in the last 72 hours. -------------------------------------------------------------------------------------------------------------------  Cardiac Enzymes No results for input(s): CKMB, TROPONINI, MYOGLOBIN in the last 168 hours.  Invalid input(s): CK ------------------------------------------------------------------------------------------------------------------ Invalid input(s): POCBNP  ---------------------------------------------------------------------------------------------------------------  Urinalysis    Component Value Date/Time   COLORURINE YELLOW (A) 04/07/2017 1945   APPEARANCEUR CLEAR (A) 04/07/2017 1945   APPEARANCEUR Cloudy (A) 12/06/2015  1118   LABSPEC 1.009 04/07/2017 1945   LABSPEC 1.010 08/13/2012 0905   PHURINE 7.0 04/07/2017 1945   GLUCOSEU NEGATIVE 04/07/2017 1945   GLUCOSEU Negative 08/13/2012 0905   HGBUR SMALL (A) 04/07/2017 1945   BILIRUBINUR NEGATIVE 04/07/2017 1945   BILIRUBINUR Negative 12/06/2015 1118   BILIRUBINUR Negative 08/13/2012 0905   KETONESUR NEGATIVE 04/07/2017 1945   PROTEINUR NEGATIVE 04/07/2017 1945   NITRITE NEGATIVE 04/07/2017 1945   LEUKOCYTESUR MODERATE (A) 04/07/2017 1945   LEUKOCYTESUR Negative 12/06/2015 1118   LEUKOCYTESUR 1+ 08/13/2012 0905     RADIOLOGY: Dg Chest 2 View  Result Date: 04/07/2017 CLINICAL DATA:  Cough and lower back pain status post upper endoscopy earlier today. EXAM: CHEST  2 VIEW COMPARISON:  Report from 04/15/2002 FINDINGS: The heart size and mediastinal contours are within normal limits. Air-fluid level within a moderate-sized hiatal hernia projects over the normal sized cardiac silhouette. No evidence of aspiration. No pneumothorax or pneumoperitoneum. Both lungs are clear. The visualized skeletal structures are unremarkable. IMPRESSION: Moderate-sized hiatal hernia.  No active pulmonary disease. Electronically Signed   By: Ashley Royalty M.D.   On: 04/07/2017 20:02   Ct Chest W Contrast  Result Date: 04/07/2017 CLINICAL DATA:  Cough and elevated white blood cell count after endoscopy today. EXAM: CT CHEST WITH CONTRAST TECHNIQUE: Multidetector CT imaging of the chest was performed during intravenous contrast administration. A sip of enteric contrast was administered at the time of the exam. CONTRAST:  35mL ISOVUE-300 IOPAMIDOL (ISOVUE-300) INJECTION 61% COMPARISON:  Radiographs earlier this day. FINDINGS: Cardiovascular: Thoracic aorta is normal in caliber, tortuous descending aorta. No evidence of aortic dissection. Heart is normal in size. No pericardial effusion. Mediastinum/Nodes: No enlarged mediastinal or hilar lymph nodes. Administered enteric contrast within the  mid and distal esophagus which is patulous. Mild wall thickening about the esophagus at the level of the hiatal hernia, more proximal soft esophagus is unremarkable. Moderate-sized hiatal hernia with fluid in the herniated stomach. There is no pneumomediastinum or paraesophageal inflammation. Visualized thyroid gland is normal. Lungs/Pleura: Mild patchy and nodular opacities in the perihilar left lung most prominent involving the lower lobe, with upper lobe and lingular involvement. The right lung is clear. There is no pleural effusion. Suspect mild retained secretions in the left mainstem bronchus. Trachea and right bronchial tree are clear. No pulmonary edema. Upper Abdomen: Probable hepatic steatosis.  No acute finding. Musculoskeletal: There are no acute or suspicious osseous abnormalities. There is degenerative change in the spine. IMPRESSION: 1. Mild patchy and nodular opacities in the left perihilar lung most prominent in the lower lobe which may be infectious or secondary to aspiration. Mild retained secretions in the left mainstem bronchus. 2. Moderate hiatal hernia with patulous upper esophagus. No evidence of post endoscopy complication. 3. Incidental hepatic steatosis in the upper abdomen. Electronically Signed   By: Jeb Levering M.D.   On: 04/07/2017 21:14    EKG: Orders placed or performed during the hospital encounter of 04/07/17  . EKG 12-Lead  . EKG 12-Lead  . ED EKG  . ED EKG  . EKG 12-Lead  . EKG 12-Lead    IMPRESSION AND  PLAN:   1.  Aspiration pneumonia, in a hospital setting. Will start Zosyn and vancomycin IV. 2.  Hypertension we will restart home medications. 3.  Recurrent esophageal strictures, status post dilatation. 4.  Nausea and vomiting related to the recent procedure.  Will treat with antinausea medication.  All the records are reviewed and case discussed with ED provider. Management plans discussed with the patient, family and they are in agreement.  CODE  STATUS:    Code Status Orders  (From admission, onward)        Start     Ordered   04/07/17 2314  Full code  Continuous     04/07/17 2313    Code Status History    Date Active Date Inactive Code Status Order ID Comments User Context   This patient has a current code status but no historical code status.    Advance Directive Documentation     Most Recent Value  Type of Advance Directive  Healthcare Power of Attorney, Living will  Pre-existing out of facility DNR order (yellow form or pink MOST form)  No data  "MOST" Form in Place?  No data       TOTAL TIME TAKING CARE OF THIS PATIENT: 35 minutes.    Amelia Jo M.D on 04/08/2017 at 3:54 AM  Between 7am to 6pm - Pager - [phone]  After 6pm go to [email or web address] - password [email or web address] [facility]  [facility]  Office  [phone]  CC: Primary care physician; Adin Hector, MD

## 2017-04-08 NOTE — Care Management Note (Signed)
Case Management Note  Patient Details  Name: CALIYA NARINE MRN: [id number] Date of Birth: 1955-06-04  Subjective/Objective:                 Admitted with aspiration pneumonia.  Had EGD earlier in the day of admission.   Action/Plan:   Expected Discharge Date:                  Expected Discharge Plan:     In-House Referral:     Discharge planning Services     Post Acute Care Choice:    Choice offered to:     DME Arranged:    DME Agency:     HH Arranged:    HH Agency:     Status of Service:     If discussed at [employer] of Stay Meetings, dates discussed:    Additional Comments:  Katrina Stack, RN 04/08/2017, 12:29 PM

## 2017-04-08 NOTE — Progress Notes (Signed)
[facility] NAME: Lisa Herman    MR#:  [id number]  DATE OF BIRTH:  [birth year]  SUBJECTIVE:  CHIEF COMPLAINT:   Chief Complaint  Patient presents with  . Cough  . Back Pain    REVIEW OF SYSTEMS:  ROS  DRUG ALLERGIES:   Allergies  Allergen Reactions  . Atorvastatin     Other reaction(s): Abdominal Pain  . Bupropion     Other reaction(s): Other (See Comments) Elevated BP  . Myrbetriq [Mirabegron]     elevated BP  . Sertraline     Other reaction(s): Other (See Comments) Prolonged QT interval   VITALS:  Blood pressure 135/90, pulse 72, temperature 98.3 F (36.8 C), temperature source Oral, resp. rate 14, height 5\' 4"  (1.626 m), weight 194 lb 11.2 oz (88.3 kg), SpO2 97 %. PHYSICAL EXAMINATION:  Physical Exam LABORATORY PANEL:  Female CBC Recent Labs  Lab 04/08/17 0407  WBC 18.9*  HGB 12.0  HCT 36.4  PLT 257   ------------------------------------------------------------------------------------------------------------------ Chemistries  Recent Labs  Lab 04/07/17 1945 04/08/17 0407 04/08/17 0900  NA 135 140  --   K 3.6 3.2*  --   CL 99* 106  --   CO2 24 27  --   GLUCOSE 137* 87  --   BUN 13 10  --   CREATININE 0.68 0.58  --   CALCIUM 9.8 8.8*  --   MG  --   --  1.9  AST 50*  --   --   ALT 59*  --   --   ALKPHOS 45  --   --   BILITOT 0.8  --   --    RADIOLOGY:  Dg Chest 2 View  Result Date: 04/07/2017 CLINICAL DATA:  Cough and lower back pain status post upper endoscopy earlier today. EXAM: CHEST  2 VIEW COMPARISON:  Report from 04/15/2002 FINDINGS: The heart size and mediastinal contours are within normal limits. Air-fluid level within a moderate-sized hiatal hernia projects over the normal sized cardiac silhouette. No evidence of aspiration. No pneumothorax or pneumoperitoneum. Both lungs are clear. The visualized skeletal structures are unremarkable. IMPRESSION: Moderate-sized hiatal hernia.  No  active pulmonary disease. Electronically Signed   By: Ashley Royalty M.D.   On: 04/07/2017 20:02   Ct Chest W Contrast  Result Date: 04/07/2017 CLINICAL DATA:  Cough and elevated white blood cell count after endoscopy today. EXAM: CT CHEST WITH CONTRAST TECHNIQUE: Multidetector CT imaging of the chest was performed during intravenous contrast administration. A sip of enteric contrast was administered at the time of the exam. CONTRAST:  69mL ISOVUE-300 IOPAMIDOL (ISOVUE-300) INJECTION 61% COMPARISON:  Radiographs earlier this day. FINDINGS: Cardiovascular: Thoracic aorta is normal in caliber, tortuous descending aorta. No evidence of aortic dissection. Heart is normal in size. No pericardial effusion. Mediastinum/Nodes: No enlarged mediastinal or hilar lymph nodes. Administered enteric contrast within the mid and distal esophagus which is patulous. Mild wall thickening about the esophagus at the level of the hiatal hernia, more proximal soft esophagus is unremarkable. Moderate-sized hiatal hernia with fluid in the herniated stomach. There is no pneumomediastinum or paraesophageal inflammation. Visualized thyroid gland is normal. Lungs/Pleura: Mild patchy and nodular opacities in the perihilar left lung most prominent involving the lower lobe, with upper lobe and lingular involvement. The right lung is clear. There is no pleural effusion. Suspect mild retained secretions in the left mainstem bronchus. Trachea and right bronchial tree are clear. No pulmonary edema.  Upper Abdomen: Probable hepatic steatosis.  No acute finding. Musculoskeletal: There are no acute or suspicious osseous abnormalities. There is degenerative change in the spine. IMPRESSION: 1. Mild patchy and nodular opacities in the left perihilar lung most prominent in the lower lobe which may be infectious or secondary to aspiration. Mild retained secretions in the left mainstem bronchus. 2. Moderate hiatal hernia with patulous upper esophagus. No  evidence of post endoscopy complication. 3. Incidental hepatic steatosis in the upper abdomen. Electronically Signed   By: Jeb Levering M.D.   On: 04/07/2017 21:14   ASSESSMENT AND PLAN:   1.  Aspiration pneumonia. S/p esophageal dilatation. Continue Zosyn and discontinue vancomycin IV.  Follow-up CBC and blood culture.  Robitussin as needed. 2.  Hypertension.  Continue home medications. 3.  Recurrent esophageal strictures, status post dilatation. 4.  Nausea and vomiting related to the recent procedure.   Improved.  Zofran as needed.  Hypokalemia.  Give potassium supplement, magnesium is normal, follow-up potassium level. All the records are reviewed and case discussed with Care Management/Social Worker. Management plans discussed with the patient, her husband and they are in agreement.  CODE STATUS: Full Code  TOTAL TIME TAKING CARE OF THIS PATIENT: 35 minutes.   More than 50% of the time was spent in counseling/coordination of care: YES  POSSIBLE D/C IN 2 DAYS, DEPENDING ON CLINICAL CONDITION.   Demetrios Loll M.D on 04/08/2017 at 12:57 PM  Between 7am to 6pm - Pager - [phone]  After 6pm go to [email or web address] - [place] [facility]

## 2017-04-08 NOTE — Progress Notes (Signed)
Pharmacy Antibiotic Note  Lisa Herman is a 62 y.o. female admitted on 04/07/2017 with pneumonia.  Pharmacy has been consulted for vancomycin dosing.  Plan: DW 66kg  Vd 46L keo 0.069 hr-1  T1/2 10 hours Vancomycin 1 gram q 12 hours ordered with stacked dosing. Level before 5th dose. Goal trough 15-20  Height: 5\' 4"  (162.6 cm) Weight: 197 lb 6.4 oz (89.5 kg) IBW/kg (Calculated) : 54.7  Temp (24hrs), Avg:98.3 F (36.8 C), Min:97 F (36.1 C), Max:100.1 F (37.8 C)  Recent Labs  Lab 04/07/17 1945  WBC 21.7*  CREATININE 0.68    Estimated Creatinine Clearance: 80 mL/min (by C-G formula based on SCr of 0.68 mg/dL).    Allergies  Allergen Reactions  . Atorvastatin     Other reaction(s): Abdominal Pain  . Bupropion     Other reaction(s): Other (See Comments) Elevated BP  . Myrbetriq [Mirabegron]     elevated BP  . Sertraline     Other reaction(s): Other (See Comments) Prolonged QT interval    Antimicrobials this admission: Vancomycin, Zosyn 1/8  >>    >>   Dose adjustments this admission:   Microbiology results: 1/8 BCx: pending 1/8 Sputum: pending       1/8: CT chest: L lung opacities. Infection or aspiration  1/8 UA: LE(+) NO2(-) WBC 6-30 Thank you for allowing pharmacy to be a part of this patient's care.  Cathy Ropp S 04/08/2017 1:06 AM

## 2017-04-09 ENCOUNTER — Encounter: Payer: Self-pay | Admitting: Internal Medicine

## 2017-04-09 LAB — BASIC METABOLIC PANEL
ANION GAP: 7 (ref 5–15)
BUN: 15 mg/dL (ref 6–20)
CO2: 26 mmol/L (ref 22–32)
Calcium: 8.6 mg/dL — ABNORMAL LOW (ref 8.9–10.3)
Chloride: 108 mmol/L (ref 101–111)
Creatinine, Ser: 0.63 mg/dL (ref 0.44–1.00)
Glucose, Bld: 95 mg/dL (ref 65–99)
POTASSIUM: 3.8 mmol/L (ref 3.5–5.1)
SODIUM: 141 mmol/L (ref 135–145)

## 2017-04-09 LAB — CBC
HEMATOCRIT: 35.1 % (ref 35.0–47.0)
Hemoglobin: 11.8 g/dL — ABNORMAL LOW (ref 12.0–16.0)
MCH: 29.1 pg (ref 26.0–34.0)
MCHC: 33.6 g/dL (ref 32.0–36.0)
MCV: 86.5 fL (ref 80.0–100.0)
Platelets: 234 10*3/uL (ref 150–440)
RBC: 4.06 MIL/uL (ref 3.80–5.20)
RDW: 16 % — AB (ref 11.5–14.5)
WBC: 9.9 10*3/uL (ref 3.6–11.0)

## 2017-04-09 LAB — GLUCOSE, CAPILLARY: GLUCOSE-CAPILLARY: 111 mg/dL — AB (ref 65–99)

## 2017-04-09 LAB — SURGICAL PATHOLOGY

## 2017-04-09 LAB — HIV ANTIBODY (ROUTINE TESTING W REFLEX): HIV SCREEN 4TH GENERATION: NONREACTIVE

## 2017-04-09 MED ORDER — AMOXICILLIN-POT CLAVULANATE 875-125 MG PO TABS: 1.0000 | ORAL_TABLET | Freq: Two times a day (BID) | ORAL | 0 refills | Status: AC

## 2017-04-09 MED ORDER — METRONIDAZOLE 500 MG PO TABS: 500.0000 mg | ORAL_TABLET | Freq: Three times a day (TID) | ORAL | 0 refills | Status: AC

## 2017-04-09 NOTE — Discharge Summary (Signed)
[facility] NAME: Lisa Herman    MR#:  [id number]  DATE OF BIRTH:  09-Apr-1955  DATE OF ADMISSION:  04/07/2017 ADMITTING PHYSICIAN: Amelia Jo, MD  DATE OF DISCHARGE: No discharge date for patient encounter.  PRIMARY CARE PHYSICIAN: Tama High III, MD    ADMISSION DIAGNOSIS:  Aspiration pneumonia, unspecified aspiration pneumonia type, unspecified laterality, unspecified part of lung ([place]) [J69.0]  DISCHARGE DIAGNOSIS:  Active Problems:   Aspiration pneumonia ([place])   SECONDARY DIAGNOSIS:   Past Medical History:  Diagnosis Date  . Anemia   . Asthma   . Depression   . Dyspepsia   . Esophageal stricture   . GERD (gastroesophageal reflux disease)   . High blood pressure   . History of hiatal hernia   . Osteoporosis   . Osteoporosis   . Reflux     HOSPITAL COURSE:  Hospital course 1 acute aspiration pneumonia Treated empirically initially while in the hospital with IV Zosyn/vancomycin, IV fluids for rehydration, and patient did well, patient discharged on Augmentin/Flagyl to complete 5-day course  2 chronic benign essential hypertension Stable on home regiment which was continued while in house   3 acute recurrent esophageal strictures Status post dilatation Patient was seen by gastroenterology while in house, no intervention recommended at this time, patient advised to follow-up status post discharge for continued care/management  4 acute nausea/emesis Resolved Most likely secondary to recent procedure Treated with antiemetics PRN   DISCHARGE CONDITIONS:   On day of discharge patient is afebrile, he dynamically stable, tolerating diet, ready for discharge home with follow-up with primary care provider in 2-3 days for reevaluation, for more specific details please see chart.   CONSULTS OBTAINED:    DRUG ALLERGIES:   Allergies  Allergen Reactions  . Atorvastatin     Other reaction(s):  Abdominal Pain  . Bupropion     Other reaction(s): Other (See Comments) Elevated BP  . Myrbetriq [Mirabegron]     elevated BP  . Sertraline     Other reaction(s): Other (See Comments) Prolonged QT interval    DISCHARGE MEDICATIONS:      DISCHARGE INSTRUCTIONS:     If you experience worsening of your admission symptoms, develop shortness of breath, life threatening emergency, suicidal or homicidal thoughts you must seek medical attention immediately by calling 911 or calling your MD immediately  if symptoms less severe.  You Must read complete instructions/literature along with all the possible adverse reactions/side effects for all the Medicines you take and that have been prescribed to you. Take any new Medicines after you have completely understood and accept all the possible adverse reactions/side effects.   Please note  You were cared for by a hospitalist during your hospital stay. If you have any questions about your discharge medications or the care you received while you were in the hospital after you are discharged, you can call the unit and asked to speak with the hospitalist on call if the hospitalist that took care of you is not available. Once you are discharged, your primary care physician will handle any further medical issues. Please note that NO REFILLS for any discharge medications will be authorized once you are discharged, as it is imperative that you return to your primary care physician (or establish a relationship with a primary care physician if you do not have one) for your aftercare needs so that they can reassess your need for medications and monitor your lab values.  Today   CHIEF COMPLAINT:   Chief Complaint  Patient presents with  . Cough  . Back Pain    HISTORY OF PRESENT ILLNESS:  HPI Lisa Herman  is a 62 y.o. female with a known history of hypertension, asthma and recurrent esophageal strictures.  Patient underwent EGD procedure earlier in  the day esophageal dilatation.  Status post procedure she was told that she had a lot of secretions and required frequent suctioning.  She was discharged home and 2 hours after the procedure she noted that she had fever 101, and shortness of breath and cough with tan color sputum.  Her symptoms improved in the emergency room with nebulizer treatment and cough medication.  She was advised by gastroenterologist on call to come to emergency room.  WBC is elevated at 22,000 and chest x-ray shows pneumonia.  Chest CT confirmed the infiltrates and no evidence of post endoscopy complications. Patient was admitted for further management.  VITAL SIGNS:  Blood pressure (!) 150/97, pulse 78, temperature 97.9 F (36.6 C), resp. rate 17, height 5\' 4"  (1.626 m), weight 88.3 kg (194 lb 11.2 oz), SpO2 98 %.  I/O:    Intake/Output Summary (Last 24 hours) at 04/09/2017 1312 Last data filed at 04/09/2017 1051 Gross per 24 hour  Intake 390 ml  Output 1850 ml  Net -1460 ml    PHYSICAL EXAMINATION:  GENERAL:  62 y.o.-year-old patient lying in the bed with no acute distress.  EYES: Pupils equal, round, reactive to light and accommodation. No scleral icterus. Extraocular muscles intact.  HEENT: Head atraumatic, normocephalic. Oropharynx and nasopharynx clear.  NECK:  Supple, no jugular venous distention. No thyroid enlargement, no tenderness.  LUNGS: Normal breath sounds bilaterally, no wheezing, rales,rhonchi or crepitation. No use of accessory muscles of respiration.  CARDIOVASCULAR: S1, S2 normal. No murmurs, rubs, or gallops.  ABDOMEN: Soft, non-tender, non-distended. Bowel sounds present. No organomegaly or mass.  EXTREMITIES: No pedal edema, cyanosis, or clubbing.  NEUROLOGIC: Cranial nerves II through XII are intact. Muscle strength 5/5 in all extremities. Sensation intact. Gait not checked.  PSYCHIATRIC: The patient is alert and oriented x 3.  SKIN: No obvious rash, lesion, or ulcer.   DATA REVIEW:    CBC Recent Labs  Lab 04/09/17 0505  WBC 9.9  HGB 11.8*  HCT 35.1  PLT 234    Chemistries  Recent Labs  Lab 04/07/17 1945  04/08/17 0900 04/09/17 0505  NA 135   < >  --  141  K 3.6   < >  --  3.8  CL 99*   < >  --  108  CO2 24   < >  --  26  GLUCOSE 137*   < >  --  95  BUN 13   < >  --  15  CREATININE 0.68   < >  --  0.63  CALCIUM 9.8   < >  --  8.6*  MG  --   --  1.9  --   AST 50*  --   --   --   ALT 59*  --   --   --   ALKPHOS 45  --   --   --   BILITOT 0.8  --   --   --    < > = values in this interval not displayed.    Cardiac Enzymes No results for input(s): TROPONINI in the last 168 hours.  Microbiology Results  Results for orders placed or performed  during the hospital encounter of 04/07/17  Culture, blood (routine x 2)     Status: None (Preliminary result)   Collection Time: 04/07/17  8:40 PM  Result Value Ref Range Status   Specimen Description BLOOD LEFT ARM  Final   Special Requests   Final    BOTTLES DRAWN AEROBIC AND ANAEROBIC Blood Culture results may not be optimal due to an excessive volume of blood received in culture bottles   Culture   Final    NO GROWTH 2 DAYS Performed at [facility], [address] [place], [place] [address]    Report Status PENDING  Incomplete  Culture, blood (routine x 2)     Status: None (Preliminary result)   Collection Time: 04/07/17  9:30 PM  Result Value Ref Range Status   Specimen Description BLOOD BLOOD RIGHT HAND  Final   Special Requests   Final    BOTTLES DRAWN AEROBIC AND ANAEROBIC Blood Culture adequate volume   Culture   Final    NO GROWTH 2 DAYS Performed at [facility], [address] [place], [place] [address]    Report Status PENDING  Incomplete  Culture, expectorated sputum-assessment     Status: None   Collection Time: 04/07/17 11:05 PM  Result Value Ref Range Status   Specimen Description SPUTUM  Final   Special Requests NONE  Final   Sputum evaluation   Final     Sputum specimen not acceptable for testing.  Please recollect.   C/ TIFFANNY LAMARCHE ON 04/08/17 AT [place] Performed at [facility], [address] [place], [place] [address]    Report Status 04/08/2017 FINAL  Final  Culture, expectorated sputum-assessment     Status: None   Collection Time: 04/08/17  9:40 AM  Result Value Ref Range Status   Specimen Description SPUTUM  Final   Special Requests Normal  Final   Sputum evaluation   Final    THIS SPECIMEN IS ACCEPTABLE FOR SPUTUM CULTURE Performed at [facility], [address] [place], [place] [address]    Report Status 04/08/2017 FINAL  Final    RADIOLOGY:  Dg Chest 2 View  Result Date: 04/07/2017 CLINICAL DATA:  Cough and lower back pain status post upper endoscopy earlier today. EXAM: CHEST  2 VIEW COMPARISON:  Report from 04/15/2002 FINDINGS: The heart size and mediastinal contours are within normal limits. Air-fluid level within a moderate-sized hiatal hernia projects over the normal sized cardiac silhouette. No evidence of aspiration. No pneumothorax or pneumoperitoneum. Both lungs are clear. The visualized skeletal structures are unremarkable. IMPRESSION: Moderate-sized hiatal hernia.  No active pulmonary disease. Electronically Signed   By: Ashley Royalty M.D.   On: 04/07/2017 20:02   Ct Chest W Contrast  Result Date: 04/07/2017 CLINICAL DATA:  Cough and elevated white blood cell count after endoscopy today. EXAM: CT CHEST WITH CONTRAST TECHNIQUE: Multidetector CT imaging of the chest was performed during intravenous contrast administration. A sip of enteric contrast was administered at the time of the exam. CONTRAST:  63mL ISOVUE-300 IOPAMIDOL (ISOVUE-300) INJECTION 61% COMPARISON:  Radiographs earlier this day. FINDINGS: Cardiovascular: Thoracic aorta is normal in caliber, tortuous descending aorta. No evidence of aortic dissection. Heart is normal in size. No pericardial effusion. Mediastinum/Nodes: No enlarged  mediastinal or hilar lymph nodes. Administered enteric contrast within the mid and distal esophagus which is patulous. Mild wall thickening about the esophagus at the level of the hiatal hernia, more proximal soft esophagus is unremarkable. Moderate-sized hiatal  hernia with fluid in the herniated stomach. There is no pneumomediastinum or paraesophageal inflammation. Visualized thyroid gland is normal. Lungs/Pleura: Mild patchy and nodular opacities in the perihilar left lung most prominent involving the lower lobe, with upper lobe and lingular involvement. The right lung is clear. There is no pleural effusion. Suspect mild retained secretions in the left mainstem bronchus. Trachea and right bronchial tree are clear. No pulmonary edema. Upper Abdomen: Probable hepatic steatosis.  No acute finding. Musculoskeletal: There are no acute or suspicious osseous abnormalities. There is degenerative change in the spine. IMPRESSION: 1. Mild patchy and nodular opacities in the left perihilar lung most prominent in the lower lobe which may be infectious or secondary to aspiration. Mild retained secretions in the left mainstem bronchus. 2. Moderate hiatal hernia with patulous upper esophagus. No evidence of post endoscopy complication. 3. Incidental hepatic steatosis in the upper abdomen. Electronically Signed   By: Jeb Levering M.D.   On: 04/07/2017 21:14    EKG:   Orders placed or performed during the hospital encounter of 04/07/17  . EKG 12-Lead  . EKG 12-Lead  . ED EKG  . ED EKG  . EKG 12-Lead  . EKG 12-Lead      Management plans discussed with the patient, family and they are in agreement.  CODE STATUS:     Code Status Orders  (From admission, onward)        Start     Ordered   04/07/17 2314  Full code  Continuous     04/07/17 2313    Code Status History    Date Active Date Inactive Code Status Order ID Comments User Context   This patient has a current code status but no historical code  status.    Advance Directive Documentation     Most Recent Value  Type of Advance Directive  Healthcare Power of Attorney, Living will  Pre-existing out of facility DNR order (yellow form or pink MOST form)  No data  "MOST" Form in Place?  No data      TOTAL TIME TAKING CARE OF THIS PATIENT: 45 minutes.    Avel Peace Angle Dirusso M.D on 04/09/2017 at 1:12 PM  Between 7am to 6pm - Pager - [phone]  After 6pm go to [email or web address] - password [email or web address] [place] [facility]  Office  [phone]  CC: Primary care physician; Adin Hector, MD   Note: This dictation was prepared with Dragon dictation along with smaller phrase technology. Any transcriptional errors that result from this process are unintentional.

## 2017-04-09 NOTE — Progress Notes (Signed)
Patient discharge teaching given, including activity, diet, follow-up appoints, and medications. Patient verbalized understanding of all discharge instructions. IV access was d/c'd. Vitals are stable. Skin is intact except as charted in most recent assessments. Pt refused to be escorted out, to be driven home by family.  Lisa Herman  

## 2017-04-11 LAB — CULTURE, RESPIRATORY W GRAM STAIN: Culture: NORMAL

## 2017-04-11 LAB — CULTURE, RESPIRATORY: SPECIAL REQUESTS: NORMAL

## 2017-04-12 LAB — CULTURE, BLOOD (ROUTINE X 2)
CULTURE: NO GROWTH
CULTURE: NO GROWTH
SPECIAL REQUESTS: ADEQUATE

## 2017-04-13 DIAGNOSIS — R739 Hyperglycemia, unspecified: Secondary | ICD-10-CM | POA: Diagnosis not present

## 2017-04-14 ENCOUNTER — Other Ambulatory Visit: Payer: Self-pay | Admitting: *Deleted

## 2017-04-14 NOTE — Patient Outreach (Signed)
[facility]) Care Management  04/14/2017  Lisa Herman 09-29-1955 [id number]   Subjective: Telephone call to patient's home number, spoke with female answering phone, states Lisa Herman is currently at work,  advised to call her at work number [phone]) and HIPAA compliant message left for Ms. Bogie.       Objective: Per KPN (Knowledge Performance Now, point of care tool) and chart review, patient hospitalized 04/07/17 - 04/09/17 for Aspiration pneumonia.   Patient also has a history of anemia, asthma, Esophageal stricture, hypertension, hiatal hernia, and Osteoporosis.       Assessment:  Received UMR Transition of care referral on 04/08/17.   Transition of care follow up pending patient contact.      Plan: RNCM will call patient for 2nd telephone outreach attempt, transition of care follow up, within 10 business days if no return call.      Darnelle Derrick H. Annia Friendly, BSN, [place] Management [facility] Telephonic CM Phone: [phone] Fax: [phone]

## 2017-04-15 ENCOUNTER — Other Ambulatory Visit: Payer: Self-pay | Admitting: *Deleted

## 2017-04-15 ENCOUNTER — Ambulatory Visit: Payer: Self-pay | Admitting: *Deleted

## 2017-04-15 NOTE — Patient Outreach (Signed)
[facility]) Care Management  04/15/2017  Lisa Herman [birth year] [id number]   Subjective: Telephone call to patient's work number, no answer, message states voicemail has not been set up, and unable to leave a message.     Objective: Per KPN (Knowledge Performance Now, point of care tool) and chart review, patient hospitalized 04/07/17 - 04/09/17 for Aspiration pneumonia.   Patient also has a history of anemia, asthma, Esophageal stricture, hypertension, hiatal hernia, and Osteoporosis.       Assessment:  Received UMR Transition of care referral on 04/08/17.   Transition of care follow up pending patient contact.      Plan: RNCM will call patient for 3rd telephone outreach attempt, transition of care follow up, within 10 business days if no return call.      Ona Rathert H. Annia Friendly, BSN, [place] Management [facility] Telephonic CM Phone: [phone] Fax: [phone]

## 2017-04-17 ENCOUNTER — Ambulatory Visit: Payer: Self-pay | Admitting: *Deleted

## 2017-04-17 ENCOUNTER — Encounter: Payer: Self-pay | Admitting: *Deleted

## 2017-04-17 ENCOUNTER — Other Ambulatory Visit: Payer: Self-pay | Admitting: *Deleted

## 2017-04-17 NOTE — Patient Outreach (Addendum)
[facility]) Care Management  04/17/2017  LEEAH POLITANO [birth year] [id number]   Subjective: Telephone call to patient's work number, spoke with patient, and HIPAA verified.  Discussed [facility] Care Management UMR Transition of care follow up, patient voiced understanding, and is in agreement to follow up.  Patient states she is currently at work, is a Designer, jewellery in the ED, is feeling much better, has a follow up appointment with specialist at [facility] on 04/23/17, and has been communicating with primary MD via mychart. Discussed self care strategies for healthcare professionals,  iCare principles, voiced understanding, states her hospitalization has been a humbling experience, and she is going to take better care of self.  Patient states she is able to manage self care and has assistance as needed. Patient voices understanding of medical diagnosis and treatment plan.  States she is accessing the following [facility] benefits: outpatient pharmacy, hospital indemnity (not chosen), family medical leave act (FMLA) not needed, and is accessing sick bank hours as needed.  Patient states she does not have any education material, transition of care, care coordination, disease management, disease monitoring, transportation, community resource, or pharmacy needs at this time.  States she is very appreciative of the follow up and is in agreement to receive [place] Management information.    Objective:Per KPN (Knowledge Performance Now, point of care tool) and chart review,patient hospitalized 04/07/17 - 04/09/17 forAspiration pneumonia. Patient also has a history of anemia, asthma, Esophageal stricture, hypertension,hiatal hernia, andOsteoporosis.     Assessment: Received UMR Transition of care referral on 04/08/17. Transition of care follow up completed, no care management needs, and will proceed with case closure.      Plan:RNCM will send patient successful outreach letter, [facility]  pamphlet, and magnet. RNCM will send case closure due to follow up completed / no care management needs request to [facility].     Jene Oravec H. Annia Friendly, BSN, [place] Management [facility] Telephonic CM Phone: [phone] Fax: [phone]

## 2017-04-23 DIAGNOSIS — I10 Essential (primary) hypertension: Secondary | ICD-10-CM | POA: Diagnosis not present

## 2017-04-23 DIAGNOSIS — F3342 Major depressive disorder, recurrent, in full remission: Secondary | ICD-10-CM | POA: Diagnosis not present

## 2017-04-23 DIAGNOSIS — R918 Other nonspecific abnormal finding of lung field: Secondary | ICD-10-CM | POA: Diagnosis not present

## 2017-04-23 DIAGNOSIS — J690 Pneumonitis due to inhalation of food and vomit: Secondary | ICD-10-CM | POA: Diagnosis not present

## 2017-05-04 DIAGNOSIS — I10 Essential (primary) hypertension: Secondary | ICD-10-CM | POA: Diagnosis not present

## 2017-05-04 DIAGNOSIS — E7849 Other hyperlipidemia: Secondary | ICD-10-CM | POA: Diagnosis not present

## 2017-05-04 DIAGNOSIS — R739 Hyperglycemia, unspecified: Secondary | ICD-10-CM | POA: Diagnosis not present

## 2017-05-04 DIAGNOSIS — D649 Anemia, unspecified: Secondary | ICD-10-CM | POA: Diagnosis not present

## 2017-05-11 DIAGNOSIS — I10 Essential (primary) hypertension: Secondary | ICD-10-CM | POA: Diagnosis not present

## 2017-05-11 DIAGNOSIS — R1310 Dysphagia, unspecified: Secondary | ICD-10-CM | POA: Diagnosis not present

## 2017-05-11 DIAGNOSIS — E7849 Other hyperlipidemia: Secondary | ICD-10-CM | POA: Diagnosis not present

## 2017-05-11 DIAGNOSIS — M810 Age-related osteoporosis without current pathological fracture: Secondary | ICD-10-CM | POA: Diagnosis not present

## 2017-08-17 IMAGING — US US ABDOMEN LIMITED
1 series · 14 of 25 positions shown · non-contrast
Comparison: CT abdomen pelvis of 11/01/2013

CLINICAL DATA: Elevated liver function tests

EXAM:
US ABDOMEN LIMITED - RIGHT UPPER QUADRANT

[Series 1: us abdomen limited · 0.26mm/px · 14 of 39 slices shown]
[im 1/39]
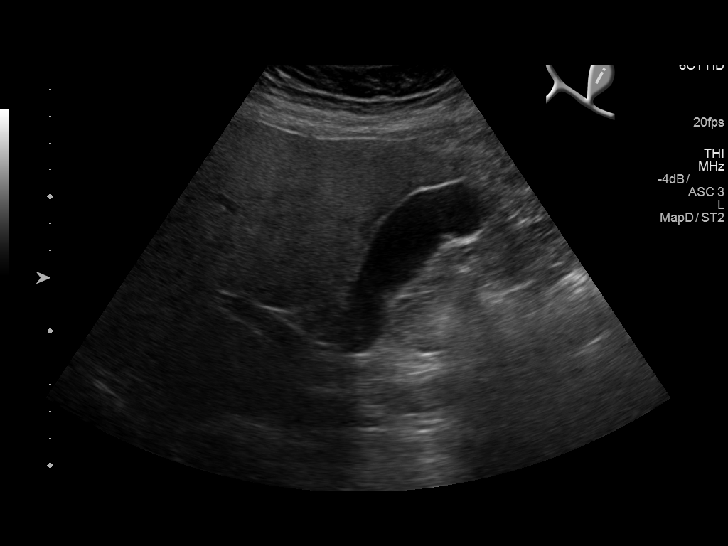
[im 4/39]
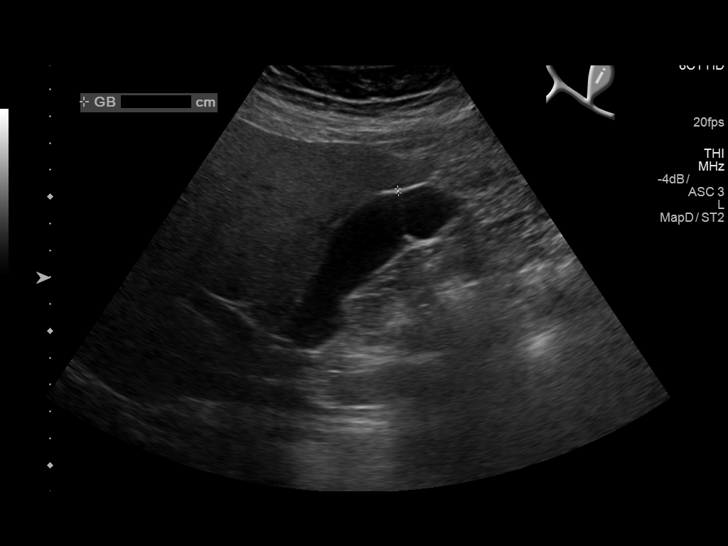
[im 7/39]
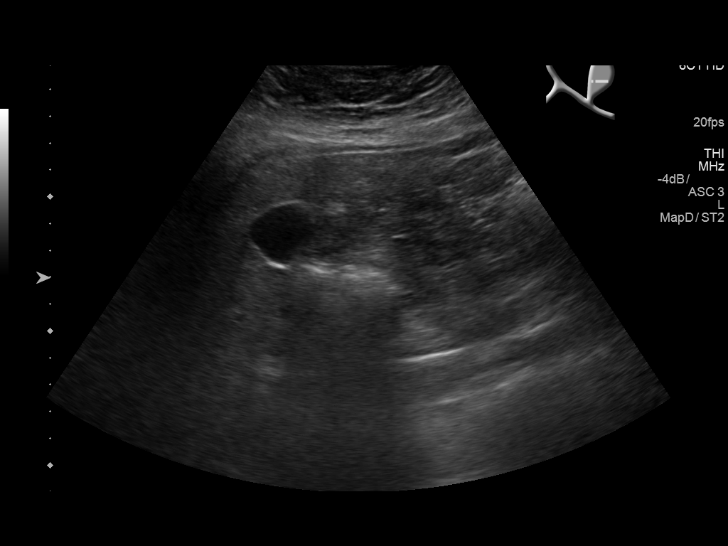
[im 10/39]
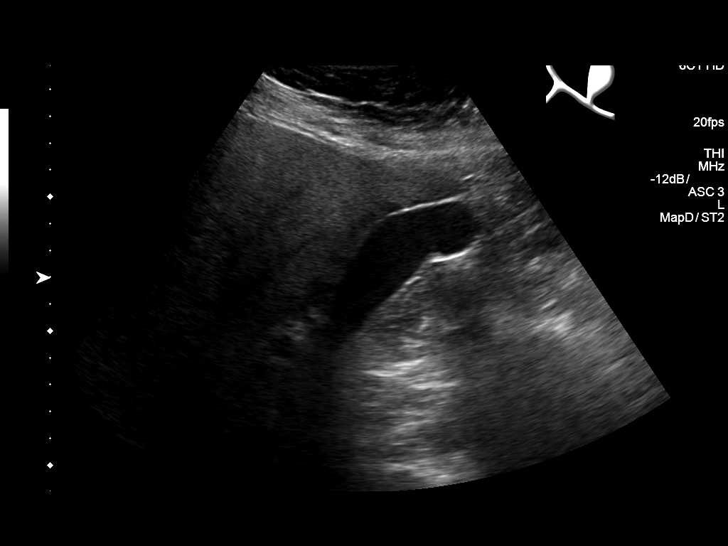
[im 13/39]
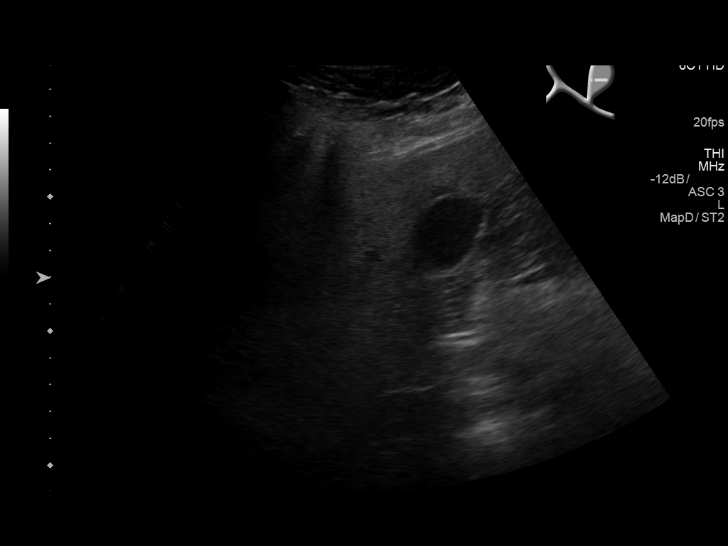
[im 15/39]
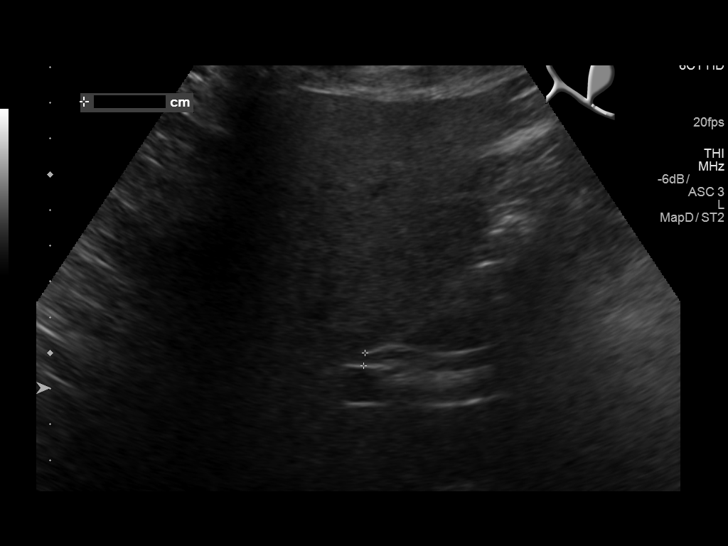
[im 18/39]
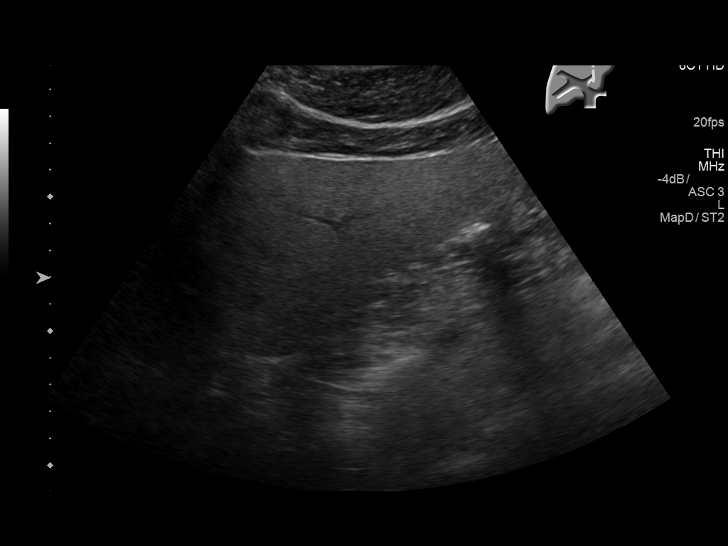
[im 21/39]
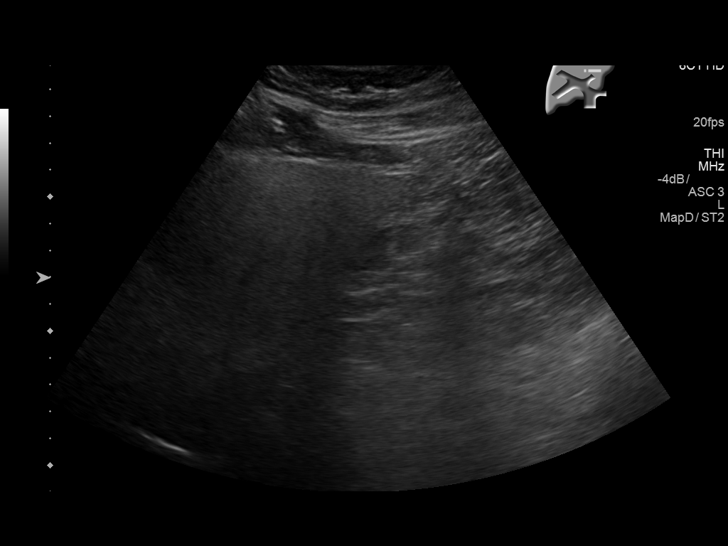
[im 24/39]
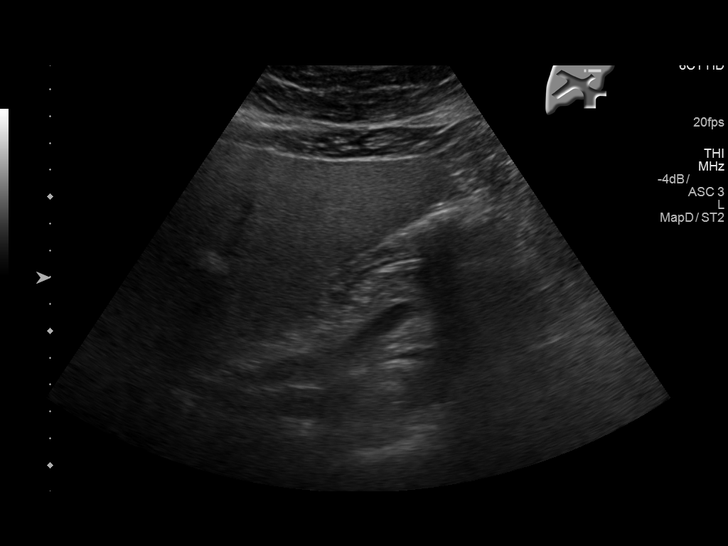
[im 26/39]
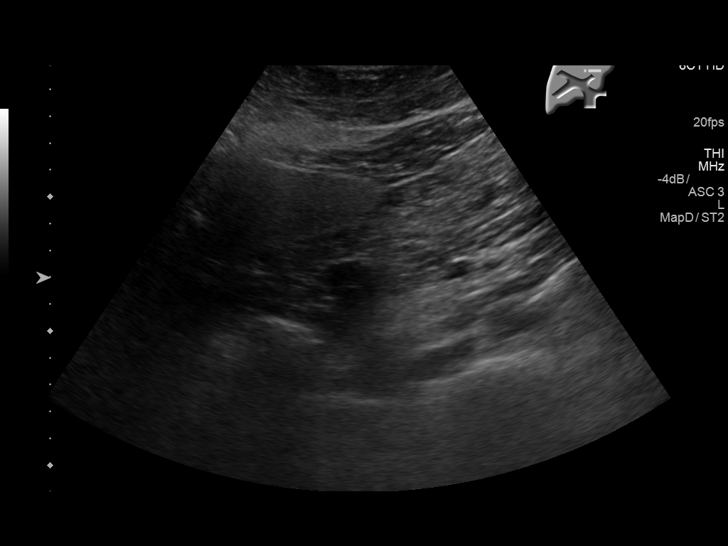
[im 29/39]
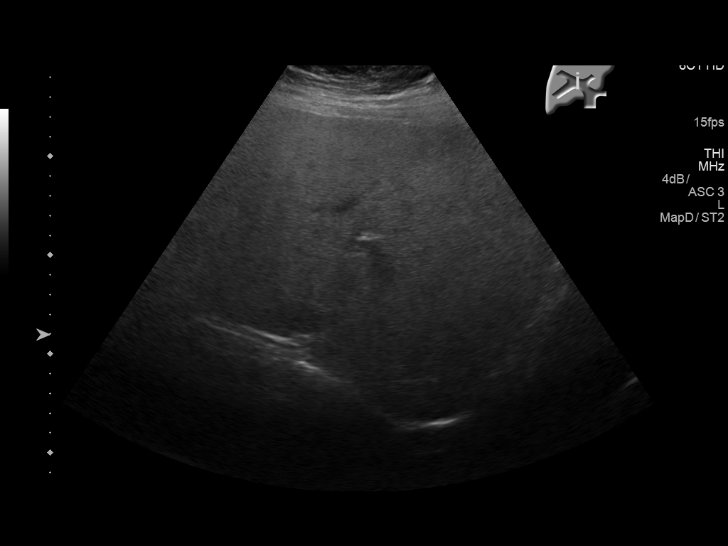
[im 32/39]
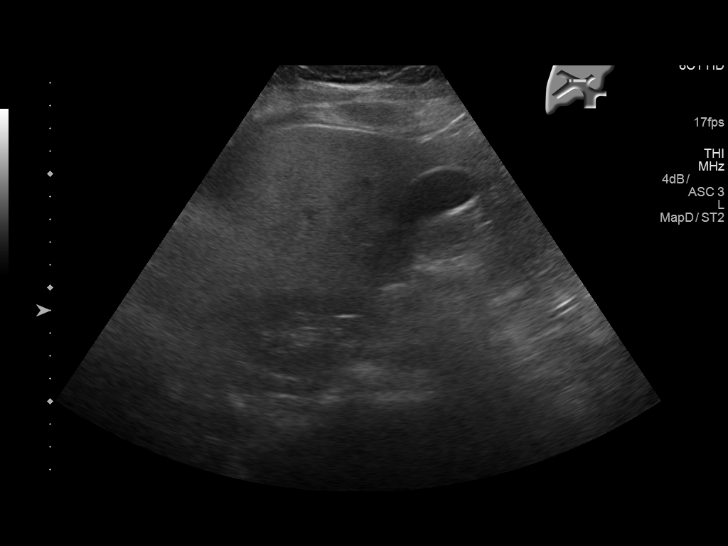
[im 35/39]
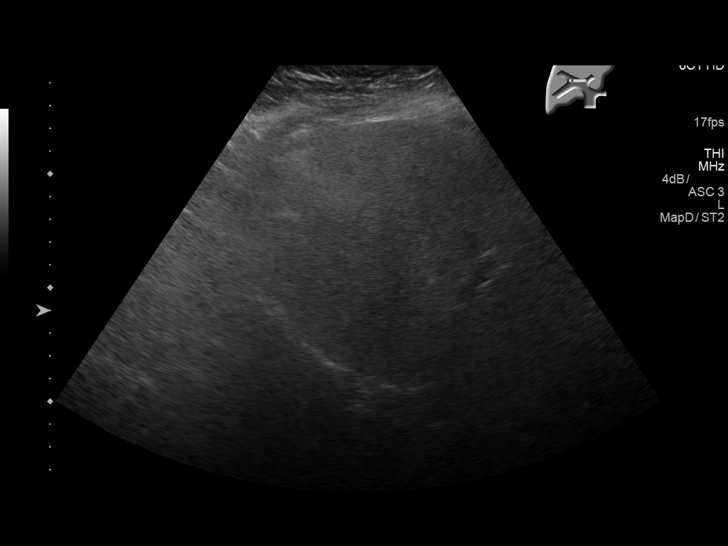
[im 39/39]
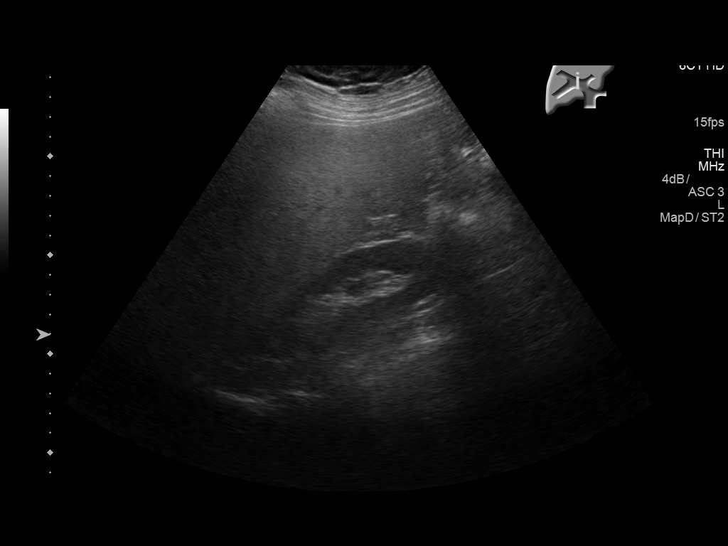

[14 of 25 positions shown; findings below may reference images not displayed]

FINDINGS: Gallbladder:

The gallbladder is visualized and no gallstones are noted. There is
no pain over the gallbladder with compression.

Common bile duct:

Diameter: The common bile duct is normal measuring 3.6 mm.

Liver:

The liver is echogenic consistent with fatty infiltration. No focal
hepatic abnormality is seen.
IMPRESSION: 1. No gallstones.
2. Echogenic liver parenchyma consistent with diffuse fatty
infiltration.

## 2017-10-30 DIAGNOSIS — M810 Age-related osteoporosis without current pathological fracture: Secondary | ICD-10-CM | POA: Diagnosis not present

## 2017-10-30 DIAGNOSIS — R739 Hyperglycemia, unspecified: Secondary | ICD-10-CM | POA: Diagnosis not present

## 2017-10-30 DIAGNOSIS — D649 Anemia, unspecified: Secondary | ICD-10-CM | POA: Diagnosis not present

## 2017-10-30 DIAGNOSIS — R1310 Dysphagia, unspecified: Secondary | ICD-10-CM | POA: Diagnosis not present

## 2017-10-30 DIAGNOSIS — I10 Essential (primary) hypertension: Secondary | ICD-10-CM | POA: Diagnosis not present

## 2017-10-30 DIAGNOSIS — E7849 Other hyperlipidemia: Secondary | ICD-10-CM | POA: Diagnosis not present

## 2017-11-06 DIAGNOSIS — R1013 Epigastric pain: Secondary | ICD-10-CM | POA: Diagnosis not present

## 2017-11-06 DIAGNOSIS — M810 Age-related osteoporosis without current pathological fracture: Secondary | ICD-10-CM | POA: Diagnosis not present

## 2017-11-06 DIAGNOSIS — I10 Essential (primary) hypertension: Secondary | ICD-10-CM | POA: Diagnosis not present

## 2017-11-06 DIAGNOSIS — R1310 Dysphagia, unspecified: Secondary | ICD-10-CM | POA: Diagnosis not present

## 2018-01-20 DIAGNOSIS — Z01419 Encounter for gynecological examination (general) (routine) without abnormal findings: Secondary | ICD-10-CM | POA: Diagnosis not present

## 2018-01-20 DIAGNOSIS — Z1231 Encounter for screening mammogram for malignant neoplasm of breast: Secondary | ICD-10-CM | POA: Diagnosis not present

## 2018-01-20 DIAGNOSIS — Z6827 Body mass index (BMI) 27.0-27.9, adult: Secondary | ICD-10-CM | POA: Diagnosis not present

## 2018-01-25 DIAGNOSIS — M159 Polyosteoarthritis, unspecified: Secondary | ICD-10-CM | POA: Diagnosis not present

## 2018-01-25 DIAGNOSIS — M1811 Unilateral primary osteoarthritis of first carpometacarpal joint, right hand: Secondary | ICD-10-CM | POA: Diagnosis not present

## 2018-01-25 DIAGNOSIS — M2550 Pain in unspecified joint: Secondary | ICD-10-CM | POA: Diagnosis not present

## 2018-01-25 DIAGNOSIS — M1812 Unilateral primary osteoarthritis of first carpometacarpal joint, left hand: Secondary | ICD-10-CM | POA: Diagnosis not present

## 2018-02-18 ENCOUNTER — Telehealth: Payer: 59 | Admitting: Family Medicine

## 2018-02-18 DIAGNOSIS — N390 Urinary tract infection, site not specified: Secondary | ICD-10-CM

## 2018-02-18 MED ORDER — NITROFURANTOIN MONOHYD MACRO 100 MG PO CAPS: 100.0000 mg | ORAL_CAPSULE | Freq: Two times a day (BID) | ORAL | 0 refills | Status: AC

## 2018-02-18 NOTE — Progress Notes (Signed)

## 2018-02-28 ENCOUNTER — Telehealth: Payer: 59 | Admitting: Family

## 2018-02-28 DIAGNOSIS — R399 Unspecified symptoms and signs involving the genitourinary system: Secondary | ICD-10-CM

## 2018-02-28 MED ORDER — CIPROFLOXACIN HCL 500 MG PO TABS: 500.0000 mg | ORAL_TABLET | Freq: Two times a day (BID) | ORAL | 0 refills | Status: AC

## 2018-02-28 NOTE — Progress Notes (Signed)
We are sorry that you are not feeling well.  Here is how we plan to help!  Based on what you shared with me it looks like you most likely have a simple urinary tract infection.  A UTI (Urinary Tract Infection) is a bacterial infection of the bladder.  Most cases of urinary tract infections are simple to treat but a key part of your care is to encourage you to drink plenty of fluids and watch your symptoms carefully.  I have prescribed Ciprofloxacin 500 mg twice a day for 5 days.  Your symptoms should gradually improve. Call us if the burning in your urine worsens, you develop worsening fever, back pain or pelvic pain or if your symptoms do not resolve after completing the antibiotic.  Please make sure to follow up with your primary care provider.  Urinary tract infections can be prevented by drinking plenty of water to keep your body hydrated.  Also be sure when you wipe, wipe from front to back and don't hold it in!  If possible, empty your bladder every 4 hours.  Your e-visit answers were reviewed by a board certified advanced clinical practitioner to complete your personal care plan.  Depending on the condition, your plan could have included both over the counter or prescription medications.  If there is a problem please reply  once you have received a response from your provider.  Your safety is important to [place].  If you have drug allergies check your prescription carefully.    You can use MyChart to ask questions about today's visit, request a non-urgent call back, or ask for a work or school excuse for 24 hours related to this e-Visit. If it has been greater than 24 hours you will need to follow up with your provider, or enter a new e-Visit to address those concerns.   You will get an e-mail in the next two days asking about your experience.  I hope that your e-visit has been valuable and will speed your recovery. Thank you for using e-visits.

## 2018-04-30 DIAGNOSIS — I10 Essential (primary) hypertension: Secondary | ICD-10-CM | POA: Diagnosis not present

## 2018-04-30 DIAGNOSIS — R739 Hyperglycemia, unspecified: Secondary | ICD-10-CM | POA: Diagnosis not present

## 2018-04-30 DIAGNOSIS — D649 Anemia, unspecified: Secondary | ICD-10-CM | POA: Diagnosis not present

## 2018-04-30 DIAGNOSIS — R1310 Dysphagia, unspecified: Secondary | ICD-10-CM | POA: Diagnosis not present

## 2018-04-30 DIAGNOSIS — R1013 Epigastric pain: Secondary | ICD-10-CM | POA: Diagnosis not present

## 2018-04-30 DIAGNOSIS — E7849 Other hyperlipidemia: Secondary | ICD-10-CM | POA: Diagnosis not present

## 2018-05-07 DIAGNOSIS — R3129 Other microscopic hematuria: Secondary | ICD-10-CM | POA: Diagnosis not present

## 2018-05-07 DIAGNOSIS — E7849 Other hyperlipidemia: Secondary | ICD-10-CM | POA: Diagnosis not present

## 2018-05-07 DIAGNOSIS — F3342 Major depressive disorder, recurrent, in full remission: Secondary | ICD-10-CM | POA: Diagnosis not present

## 2018-05-07 DIAGNOSIS — I10 Essential (primary) hypertension: Secondary | ICD-10-CM | POA: Diagnosis not present

## 2018-05-07 DIAGNOSIS — M810 Age-related osteoporosis without current pathological fracture: Secondary | ICD-10-CM | POA: Diagnosis not present

## 2018-05-12 DIAGNOSIS — H524 Presbyopia: Secondary | ICD-10-CM | POA: Diagnosis not present

## 2018-08-05 IMAGING — CR DG CHEST 2V
1 series · 3 of 3 positions shown · non-contrast
Comparison: Report from 04/15/2002

CLINICAL DATA: Cough and lower back pain status post upper
endoscopy earlier today.

EXAM:
CHEST  2 VIEW

[Series 1: dg chest 2 view · 0.14mm/px · 3 of 3 slices shown]
[im 1/3]
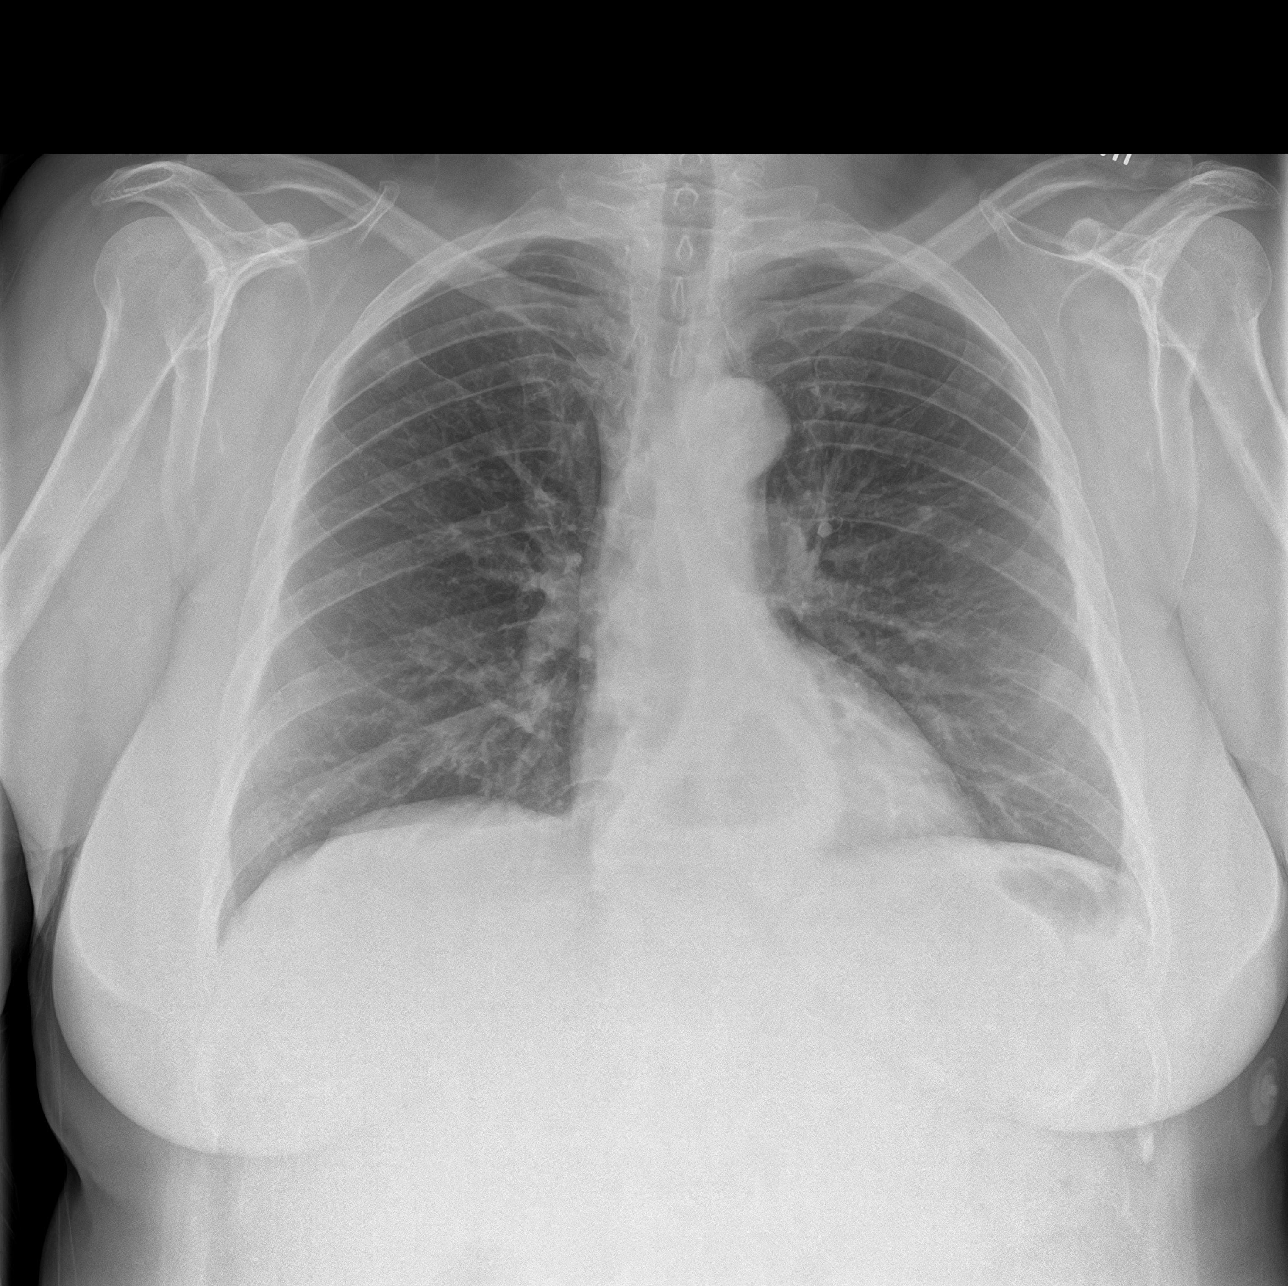
[im 2/3]
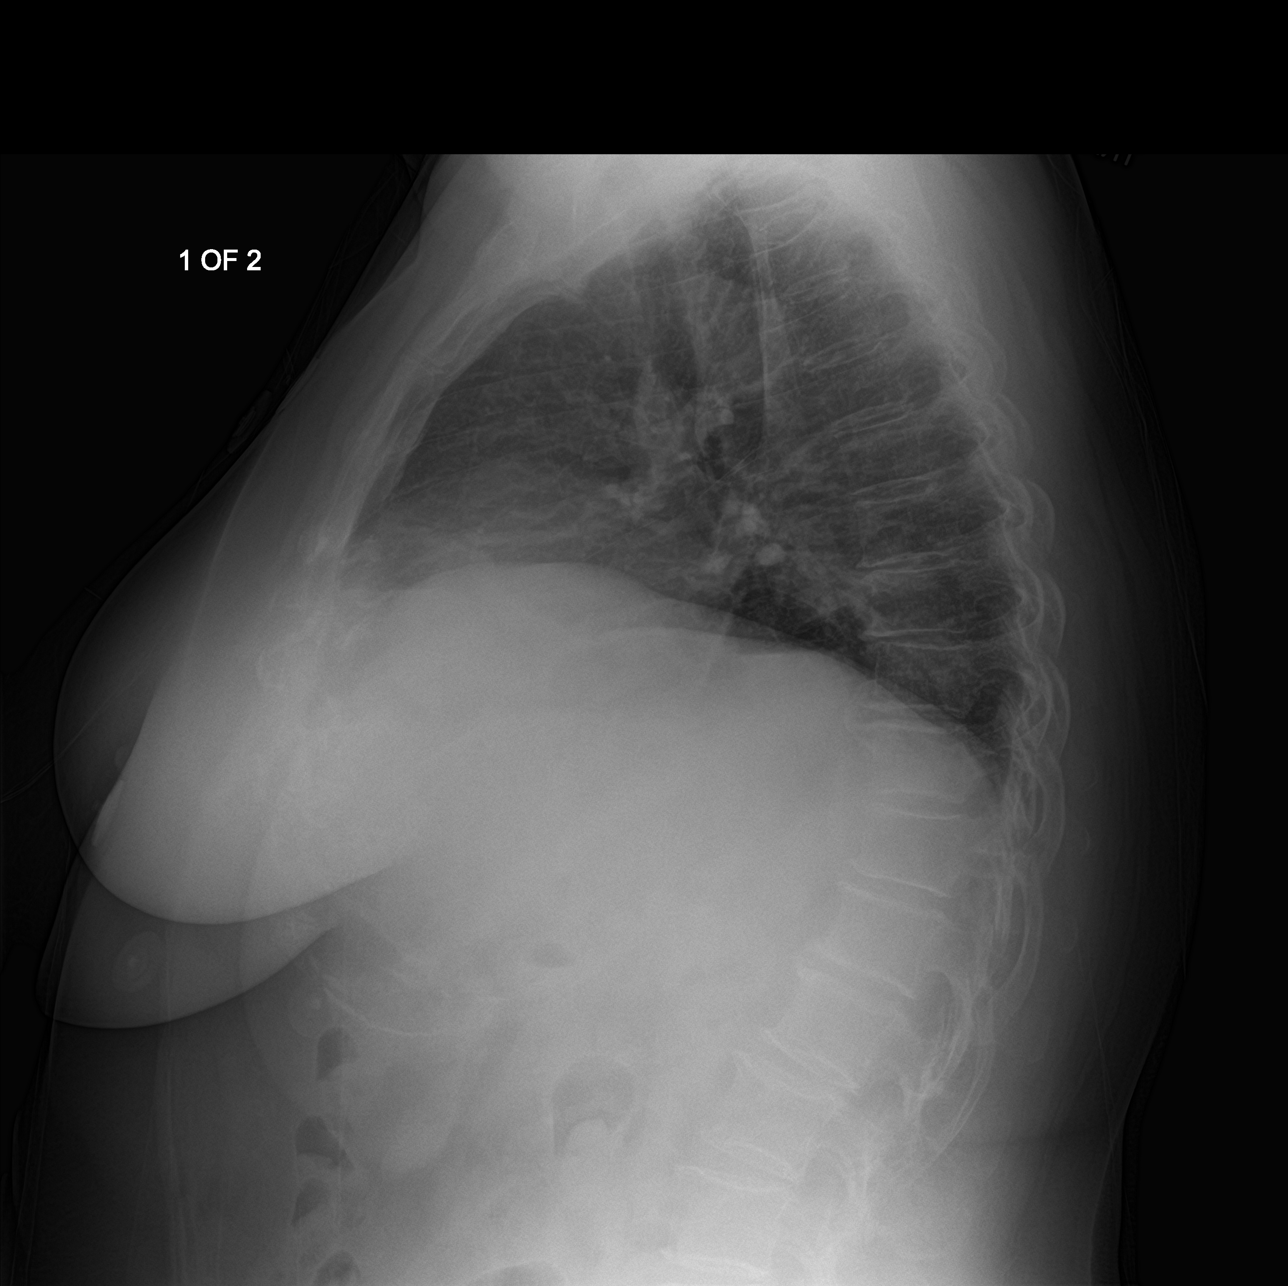
[im 3/3]
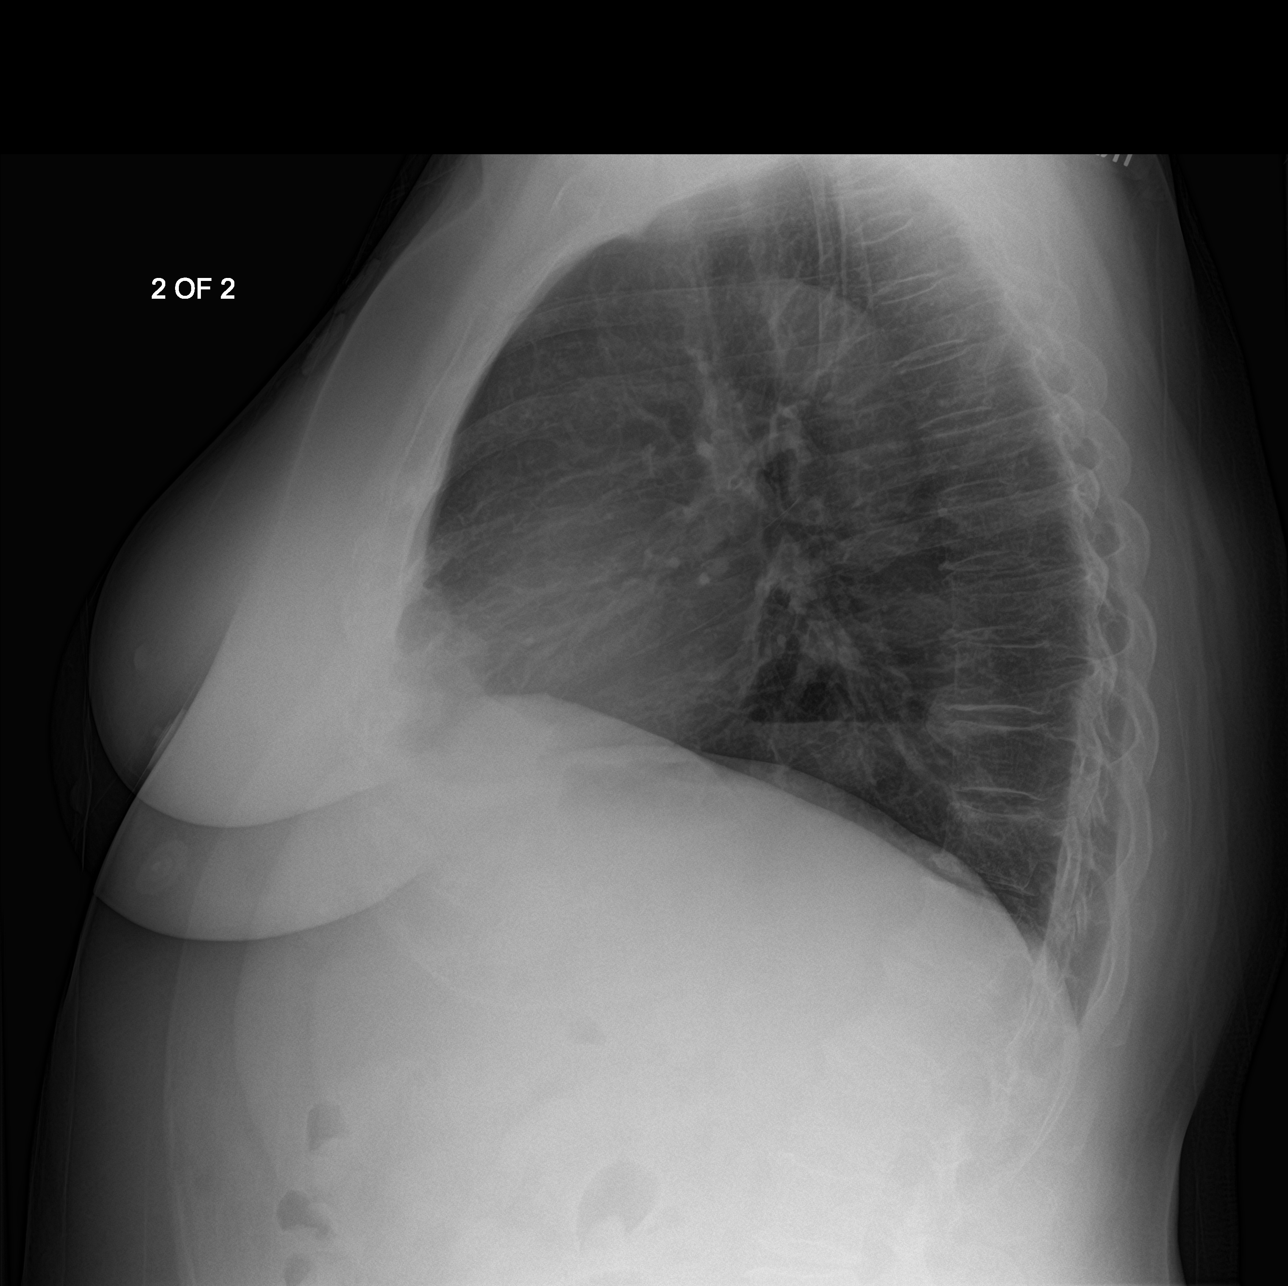

[3 of 3 positions shown; findings below may reference images not displayed]

FINDINGS: The heart size and mediastinal contours are within normal limits.
Air-fluid level within a moderate-sized hiatal hernia projects over
the normal sized cardiac silhouette. No evidence of aspiration. No
pneumothorax or pneumoperitoneum. Both lungs are clear. The
visualized skeletal structures are unremarkable.
IMPRESSION: Moderate-sized hiatal hernia.  No active pulmonary disease.

## 2018-09-03 ENCOUNTER — Other Ambulatory Visit: Payer: Self-pay | Admitting: Internal Medicine

## 2018-09-03 DIAGNOSIS — N6310 Unspecified lump in the right breast, unspecified quadrant: Secondary | ICD-10-CM
